# Patient Record
Sex: Male | Born: 1945
Health system: Southern US, Community
[De-identification: ages and names within clinical notes are randomized; demographics above are authoritative.]

## PROBLEM LIST (undated history)

## (undated) DIAGNOSIS — R519 Headache, unspecified: Secondary | ICD-10-CM

## (undated) DIAGNOSIS — I10 Essential (primary) hypertension: Secondary | ICD-10-CM

## (undated) DIAGNOSIS — I499 Cardiac arrhythmia, unspecified: Secondary | ICD-10-CM

## (undated) DIAGNOSIS — H269 Unspecified cataract: Secondary | ICD-10-CM

## (undated) DIAGNOSIS — K219 Gastro-esophageal reflux disease without esophagitis: Secondary | ICD-10-CM

## (undated) DIAGNOSIS — N400 Enlarged prostate without lower urinary tract symptoms: Secondary | ICD-10-CM

## (undated) DIAGNOSIS — R001 Bradycardia, unspecified: Secondary | ICD-10-CM

## (undated) DIAGNOSIS — G4730 Sleep apnea, unspecified: Secondary | ICD-10-CM

## (undated) DIAGNOSIS — Z8489 Family history of other specified conditions: Secondary | ICD-10-CM

## (undated) DIAGNOSIS — I77810 Thoracic aortic ectasia: Secondary | ICD-10-CM

## (undated) DIAGNOSIS — T7840XA Allergy, unspecified, initial encounter: Secondary | ICD-10-CM

## (undated) DIAGNOSIS — I340 Nonrheumatic mitral (valve) insufficiency: Secondary | ICD-10-CM

## (undated) DIAGNOSIS — R0600 Dyspnea, unspecified: Secondary | ICD-10-CM

## (undated) DIAGNOSIS — M1990 Unspecified osteoarthritis, unspecified site: Secondary | ICD-10-CM

## (undated) HISTORY — PX: OTHER SURGICAL HISTORY: SHX169

## (undated) HISTORY — PX: SHOULDER ARTHROSCOPY: SHX128

## (undated) HISTORY — PX: FRACTURE SURGERY: SHX138

## (undated) HISTORY — PX: JOINT REPLACEMENT: SHX530

## (undated) HISTORY — PX: TONSILLECTOMY: SUR1361

## (undated) HISTORY — DX: Allergy, unspecified, initial encounter: T78.40XA

## (undated) HISTORY — DX: Gastro-esophageal reflux disease without esophagitis: K21.9

## (undated) HISTORY — PX: CATARACT EXTRACTION, BILATERAL: SHX1313

## (undated) HISTORY — PX: TOTAL KNEE ARTHROPLASTY: SHX125

## (undated) HISTORY — PX: EYE SURGERY: SHX253

## (undated) HISTORY — PX: SKIN CANCER EXCISION: SHX779

## (undated) HISTORY — DX: Unspecified cataract: H26.9

## (undated) HISTORY — DX: Benign prostatic hyperplasia without lower urinary tract symptoms: N40.0

## (undated) HISTORY — PX: COSMETIC SURGERY: SHX468

---

## 1986-07-23 DIAGNOSIS — C439 Malignant melanoma of skin, unspecified: Secondary | ICD-10-CM

## 1986-07-23 HISTORY — DX: Malignant melanoma of skin, unspecified: C43.9

## 2014-01-26 DIAGNOSIS — D492 Neoplasm of unspecified behavior of bone, soft tissue, and skin: Secondary | ICD-10-CM | POA: Diagnosis not present

## 2014-02-04 DIAGNOSIS — D492 Neoplasm of unspecified behavior of bone, soft tissue, and skin: Secondary | ICD-10-CM | POA: Diagnosis not present

## 2014-02-04 DIAGNOSIS — L570 Actinic keratosis: Secondary | ICD-10-CM | POA: Diagnosis not present

## 2014-04-08 DIAGNOSIS — Z23 Encounter for immunization: Secondary | ICD-10-CM | POA: Diagnosis not present

## 2014-05-06 DIAGNOSIS — E669 Obesity, unspecified: Secondary | ICD-10-CM | POA: Diagnosis not present

## 2014-05-06 DIAGNOSIS — E785 Hyperlipidemia, unspecified: Secondary | ICD-10-CM | POA: Diagnosis not present

## 2014-05-06 DIAGNOSIS — I10 Essential (primary) hypertension: Secondary | ICD-10-CM | POA: Diagnosis not present

## 2014-05-06 DIAGNOSIS — Z0000 Encounter for general adult medical examination without abnormal findings: Secondary | ICD-10-CM | POA: Diagnosis not present

## 2014-05-06 DIAGNOSIS — K219 Gastro-esophageal reflux disease without esophagitis: Secondary | ICD-10-CM | POA: Diagnosis not present

## 2014-05-06 DIAGNOSIS — Z1159 Encounter for screening for other viral diseases: Secondary | ICD-10-CM | POA: Diagnosis not present

## 2014-05-06 DIAGNOSIS — Z13220 Encounter for screening for lipoid disorders: Secondary | ICD-10-CM | POA: Diagnosis not present

## 2014-05-06 DIAGNOSIS — Z131 Encounter for screening for diabetes mellitus: Secondary | ICD-10-CM | POA: Diagnosis not present

## 2014-05-06 DIAGNOSIS — N400 Enlarged prostate without lower urinary tract symptoms: Secondary | ICD-10-CM | POA: Diagnosis not present

## 2014-05-10 DIAGNOSIS — N400 Enlarged prostate without lower urinary tract symptoms: Secondary | ICD-10-CM | POA: Diagnosis not present

## 2014-05-10 DIAGNOSIS — Z0000 Encounter for general adult medical examination without abnormal findings: Secondary | ICD-10-CM | POA: Diagnosis not present

## 2014-05-10 DIAGNOSIS — K219 Gastro-esophageal reflux disease without esophagitis: Secondary | ICD-10-CM | POA: Diagnosis not present

## 2014-05-10 DIAGNOSIS — E669 Obesity, unspecified: Secondary | ICD-10-CM | POA: Diagnosis not present

## 2014-05-10 DIAGNOSIS — E785 Hyperlipidemia, unspecified: Secondary | ICD-10-CM | POA: Diagnosis not present

## 2014-05-10 DIAGNOSIS — I10 Essential (primary) hypertension: Secondary | ICD-10-CM | POA: Diagnosis not present

## 2014-05-10 DIAGNOSIS — Z6832 Body mass index (BMI) 32.0-32.9, adult: Secondary | ICD-10-CM | POA: Diagnosis not present

## 2014-05-12 DIAGNOSIS — Z0000 Encounter for general adult medical examination without abnormal findings: Secondary | ICD-10-CM | POA: Diagnosis not present

## 2014-05-12 DIAGNOSIS — E785 Hyperlipidemia, unspecified: Secondary | ICD-10-CM | POA: Diagnosis not present

## 2014-06-09 DIAGNOSIS — I10 Essential (primary) hypertension: Secondary | ICD-10-CM | POA: Diagnosis not present

## 2014-06-09 DIAGNOSIS — E785 Hyperlipidemia, unspecified: Secondary | ICD-10-CM | POA: Diagnosis not present

## 2014-06-09 DIAGNOSIS — I340 Nonrheumatic mitral (valve) insufficiency: Secondary | ICD-10-CM | POA: Diagnosis not present

## 2014-08-03 DIAGNOSIS — L853 Xerosis cutis: Secondary | ICD-10-CM | POA: Diagnosis not present

## 2014-08-03 DIAGNOSIS — D1801 Hemangioma of skin and subcutaneous tissue: Secondary | ICD-10-CM | POA: Diagnosis not present

## 2014-08-03 DIAGNOSIS — L570 Actinic keratosis: Secondary | ICD-10-CM | POA: Diagnosis not present

## 2014-08-03 DIAGNOSIS — D492 Neoplasm of unspecified behavior of bone, soft tissue, and skin: Secondary | ICD-10-CM | POA: Diagnosis not present

## 2014-08-03 DIAGNOSIS — D239 Other benign neoplasm of skin, unspecified: Secondary | ICD-10-CM | POA: Diagnosis not present

## 2014-08-03 DIAGNOSIS — D225 Melanocytic nevi of trunk: Secondary | ICD-10-CM | POA: Diagnosis not present

## 2014-08-03 DIAGNOSIS — I340 Nonrheumatic mitral (valve) insufficiency: Secondary | ICD-10-CM | POA: Diagnosis not present

## 2014-08-03 DIAGNOSIS — L919 Hypertrophic disorder of the skin, unspecified: Secondary | ICD-10-CM | POA: Diagnosis not present

## 2014-12-01 DIAGNOSIS — I2510 Atherosclerotic heart disease of native coronary artery without angina pectoris: Secondary | ICD-10-CM | POA: Diagnosis not present

## 2015-04-07 DIAGNOSIS — M19011 Primary osteoarthritis, right shoulder: Secondary | ICD-10-CM | POA: Diagnosis not present

## 2015-04-20 DIAGNOSIS — K219 Gastro-esophageal reflux disease without esophagitis: Secondary | ICD-10-CM | POA: Diagnosis not present

## 2015-04-20 DIAGNOSIS — Z6833 Body mass index (BMI) 33.0-33.9, adult: Secondary | ICD-10-CM | POA: Diagnosis not present

## 2015-04-20 DIAGNOSIS — Z23 Encounter for immunization: Secondary | ICD-10-CM | POA: Diagnosis not present

## 2015-04-20 DIAGNOSIS — M19019 Primary osteoarthritis, unspecified shoulder: Secondary | ICD-10-CM | POA: Diagnosis not present

## 2015-04-20 DIAGNOSIS — E785 Hyperlipidemia, unspecified: Secondary | ICD-10-CM | POA: Diagnosis not present

## 2015-04-20 DIAGNOSIS — N400 Enlarged prostate without lower urinary tract symptoms: Secondary | ICD-10-CM | POA: Diagnosis not present

## 2015-04-20 DIAGNOSIS — I10 Essential (primary) hypertension: Secondary | ICD-10-CM | POA: Diagnosis not present

## 2015-04-25 DIAGNOSIS — Z125 Encounter for screening for malignant neoplasm of prostate: Secondary | ICD-10-CM | POA: Diagnosis not present

## 2015-04-25 DIAGNOSIS — I10 Essential (primary) hypertension: Secondary | ICD-10-CM | POA: Diagnosis not present

## 2015-04-26 DIAGNOSIS — M19011 Primary osteoarthritis, right shoulder: Secondary | ICD-10-CM | POA: Diagnosis not present

## 2015-05-03 DIAGNOSIS — N400 Enlarged prostate without lower urinary tract symptoms: Secondary | ICD-10-CM | POA: Diagnosis not present

## 2015-05-03 DIAGNOSIS — Z0000 Encounter for general adult medical examination without abnormal findings: Secondary | ICD-10-CM | POA: Diagnosis not present

## 2015-05-03 DIAGNOSIS — Z1389 Encounter for screening for other disorder: Secondary | ICD-10-CM | POA: Diagnosis not present

## 2015-05-03 DIAGNOSIS — I10 Essential (primary) hypertension: Secondary | ICD-10-CM | POA: Diagnosis not present

## 2015-05-03 DIAGNOSIS — E785 Hyperlipidemia, unspecified: Secondary | ICD-10-CM | POA: Diagnosis not present

## 2015-05-03 DIAGNOSIS — Z23 Encounter for immunization: Secondary | ICD-10-CM | POA: Diagnosis not present

## 2015-05-03 DIAGNOSIS — Z6833 Body mass index (BMI) 33.0-33.9, adult: Secondary | ICD-10-CM | POA: Diagnosis not present

## 2015-05-03 DIAGNOSIS — J302 Other seasonal allergic rhinitis: Secondary | ICD-10-CM | POA: Diagnosis not present

## 2015-05-03 DIAGNOSIS — K219 Gastro-esophageal reflux disease without esophagitis: Secondary | ICD-10-CM | POA: Diagnosis not present

## 2015-05-03 DIAGNOSIS — M19019 Primary osteoarthritis, unspecified shoulder: Secondary | ICD-10-CM | POA: Diagnosis not present

## 2015-05-05 DIAGNOSIS — Z1212 Encounter for screening for malignant neoplasm of rectum: Secondary | ICD-10-CM | POA: Diagnosis not present

## 2015-05-25 DIAGNOSIS — M7552 Bursitis of left shoulder: Secondary | ICD-10-CM | POA: Diagnosis not present

## 2015-05-25 DIAGNOSIS — M19012 Primary osteoarthritis, left shoulder: Secondary | ICD-10-CM | POA: Diagnosis not present

## 2015-05-25 DIAGNOSIS — M19011 Primary osteoarthritis, right shoulder: Secondary | ICD-10-CM | POA: Diagnosis not present

## 2015-05-25 DIAGNOSIS — M7551 Bursitis of right shoulder: Secondary | ICD-10-CM | POA: Diagnosis not present

## 2015-05-30 ENCOUNTER — Other Ambulatory Visit: Payer: Self-pay | Admitting: Allergy

## 2015-05-30 ENCOUNTER — Ambulatory Visit
Admission: RE | Admit: 2015-05-30 | Discharge: 2015-05-30 | Disposition: A | Discharge status: Home / Self Care | Payer: Medicare Other | Source: Ambulatory Visit | Attending: Allergy | Admitting: Allergy

## 2015-05-30 DIAGNOSIS — J3081 Allergic rhinitis due to animal (cat) (dog) hair and dander: Secondary | ICD-10-CM | POA: Diagnosis not present

## 2015-05-30 DIAGNOSIS — R05 Cough: Secondary | ICD-10-CM

## 2015-05-30 DIAGNOSIS — J3089 Other allergic rhinitis: Secondary | ICD-10-CM | POA: Diagnosis not present

## 2015-05-30 DIAGNOSIS — R059 Cough, unspecified: Secondary | ICD-10-CM

## 2015-05-30 DIAGNOSIS — H1045 Other chronic allergic conjunctivitis: Secondary | ICD-10-CM | POA: Diagnosis not present

## 2015-05-30 DIAGNOSIS — J309 Allergic rhinitis, unspecified: Secondary | ICD-10-CM | POA: Diagnosis not present

## 2015-06-22 DIAGNOSIS — M25562 Pain in left knee: Secondary | ICD-10-CM | POA: Diagnosis not present

## 2015-06-22 DIAGNOSIS — M25561 Pain in right knee: Secondary | ICD-10-CM | POA: Diagnosis not present

## 2015-06-22 DIAGNOSIS — M7552 Bursitis of left shoulder: Secondary | ICD-10-CM | POA: Diagnosis not present

## 2015-06-22 DIAGNOSIS — M7551 Bursitis of right shoulder: Secondary | ICD-10-CM | POA: Diagnosis not present

## 2015-06-28 DIAGNOSIS — M6281 Muscle weakness (generalized): Secondary | ICD-10-CM | POA: Diagnosis not present

## 2015-06-30 DIAGNOSIS — M6281 Muscle weakness (generalized): Secondary | ICD-10-CM | POA: Diagnosis not present

## 2015-07-04 DIAGNOSIS — M6281 Muscle weakness (generalized): Secondary | ICD-10-CM | POA: Diagnosis not present

## 2015-07-06 DIAGNOSIS — M6281 Muscle weakness (generalized): Secondary | ICD-10-CM | POA: Diagnosis not present

## 2015-07-08 DIAGNOSIS — M6281 Muscle weakness (generalized): Secondary | ICD-10-CM | POA: Diagnosis not present

## 2015-07-11 DIAGNOSIS — M6281 Muscle weakness (generalized): Secondary | ICD-10-CM | POA: Diagnosis not present

## 2015-07-13 DIAGNOSIS — M6281 Muscle weakness (generalized): Secondary | ICD-10-CM | POA: Diagnosis not present

## 2015-07-20 DIAGNOSIS — M6281 Muscle weakness (generalized): Secondary | ICD-10-CM | POA: Diagnosis not present

## 2015-07-22 DIAGNOSIS — M6281 Muscle weakness (generalized): Secondary | ICD-10-CM | POA: Diagnosis not present

## 2015-08-22 DIAGNOSIS — J343 Hypertrophy of nasal turbinates: Secondary | ICD-10-CM | POA: Diagnosis not present

## 2015-08-22 DIAGNOSIS — H903 Sensorineural hearing loss, bilateral: Secondary | ICD-10-CM | POA: Diagnosis not present

## 2015-08-22 DIAGNOSIS — R42 Dizziness and giddiness: Secondary | ICD-10-CM | POA: Diagnosis not present

## 2015-08-22 DIAGNOSIS — J310 Chronic rhinitis: Secondary | ICD-10-CM | POA: Diagnosis not present

## 2015-08-22 DIAGNOSIS — H9313 Tinnitus, bilateral: Secondary | ICD-10-CM | POA: Diagnosis not present

## 2015-08-23 ENCOUNTER — Other Ambulatory Visit (INDEPENDENT_AMBULATORY_CARE_PROVIDER_SITE_OTHER): Payer: Self-pay | Admitting: Otolaryngology

## 2015-08-23 DIAGNOSIS — J329 Chronic sinusitis, unspecified: Secondary | ICD-10-CM

## 2015-08-26 ENCOUNTER — Ambulatory Visit
Admission: RE | Admit: 2015-08-26 | Discharge: 2015-08-26 | Disposition: A | Discharge status: Home / Self Care | Payer: Medicare Other | Source: Ambulatory Visit | Attending: Otolaryngology | Admitting: Otolaryngology

## 2015-08-26 DIAGNOSIS — J329 Chronic sinusitis, unspecified: Secondary | ICD-10-CM | POA: Diagnosis not present

## 2015-08-26 DIAGNOSIS — M76899 Other specified enthesopathies of unspecified lower limb, excluding foot: Secondary | ICD-10-CM | POA: Diagnosis not present

## 2015-08-26 DIAGNOSIS — M25562 Pain in left knee: Secondary | ICD-10-CM | POA: Diagnosis not present

## 2015-08-26 DIAGNOSIS — M25561 Pain in right knee: Secondary | ICD-10-CM | POA: Diagnosis not present

## 2015-08-26 DIAGNOSIS — R51 Headache: Secondary | ICD-10-CM | POA: Diagnosis not present

## 2016-03-29 DIAGNOSIS — L430 Hypertrophic lichen planus: Secondary | ICD-10-CM | POA: Diagnosis not present

## 2016-03-29 DIAGNOSIS — D485 Neoplasm of uncertain behavior of skin: Secondary | ICD-10-CM | POA: Diagnosis not present

## 2016-03-29 DIAGNOSIS — C4492 Squamous cell carcinoma of skin, unspecified: Secondary | ICD-10-CM

## 2016-03-29 DIAGNOSIS — D239 Other benign neoplasm of skin, unspecified: Secondary | ICD-10-CM | POA: Diagnosis not present

## 2016-03-29 DIAGNOSIS — C44329 Squamous cell carcinoma of skin of other parts of face: Secondary | ICD-10-CM | POA: Diagnosis not present

## 2016-03-29 DIAGNOSIS — L570 Actinic keratosis: Secondary | ICD-10-CM | POA: Diagnosis not present

## 2016-03-29 DIAGNOSIS — D225 Melanocytic nevi of trunk: Secondary | ICD-10-CM | POA: Diagnosis not present

## 2016-03-29 DIAGNOSIS — Z85820 Personal history of malignant melanoma of skin: Secondary | ICD-10-CM | POA: Diagnosis not present

## 2016-03-29 HISTORY — DX: Squamous cell carcinoma of skin, unspecified: C44.92

## 2016-04-26 DIAGNOSIS — C44329 Squamous cell carcinoma of skin of other parts of face: Secondary | ICD-10-CM | POA: Diagnosis not present

## 2016-04-30 DIAGNOSIS — E784 Other hyperlipidemia: Secondary | ICD-10-CM | POA: Diagnosis not present

## 2016-04-30 DIAGNOSIS — I10 Essential (primary) hypertension: Secondary | ICD-10-CM | POA: Diagnosis not present

## 2016-04-30 DIAGNOSIS — Z125 Encounter for screening for malignant neoplasm of prostate: Secondary | ICD-10-CM | POA: Diagnosis not present

## 2016-05-04 DIAGNOSIS — Z1212 Encounter for screening for malignant neoplasm of rectum: Secondary | ICD-10-CM | POA: Diagnosis not present

## 2016-05-07 DIAGNOSIS — Z1389 Encounter for screening for other disorder: Secondary | ICD-10-CM | POA: Diagnosis not present

## 2016-05-07 DIAGNOSIS — K219 Gastro-esophageal reflux disease without esophagitis: Secondary | ICD-10-CM | POA: Diagnosis not present

## 2016-05-07 DIAGNOSIS — I10 Essential (primary) hypertension: Secondary | ICD-10-CM | POA: Diagnosis not present

## 2016-05-07 DIAGNOSIS — Z23 Encounter for immunization: Secondary | ICD-10-CM | POA: Diagnosis not present

## 2016-05-07 DIAGNOSIS — N400 Enlarged prostate without lower urinary tract symptoms: Secondary | ICD-10-CM | POA: Diagnosis not present

## 2016-05-07 DIAGNOSIS — Z0000 Encounter for general adult medical examination without abnormal findings: Secondary | ICD-10-CM | POA: Diagnosis not present

## 2016-05-07 DIAGNOSIS — J302 Other seasonal allergic rhinitis: Secondary | ICD-10-CM | POA: Diagnosis not present

## 2016-05-07 DIAGNOSIS — R413 Other amnesia: Secondary | ICD-10-CM | POA: Diagnosis not present

## 2016-05-07 DIAGNOSIS — Z6835 Body mass index (BMI) 35.0-35.9, adult: Secondary | ICD-10-CM | POA: Diagnosis not present

## 2016-05-07 DIAGNOSIS — R51 Headache: Secondary | ICD-10-CM | POA: Diagnosis not present

## 2016-05-07 DIAGNOSIS — E784 Other hyperlipidemia: Secondary | ICD-10-CM | POA: Diagnosis not present

## 2016-07-24 DIAGNOSIS — D229 Melanocytic nevi, unspecified: Secondary | ICD-10-CM | POA: Diagnosis not present

## 2016-07-24 DIAGNOSIS — L570 Actinic keratosis: Secondary | ICD-10-CM | POA: Diagnosis not present

## 2016-07-24 DIAGNOSIS — Z85820 Personal history of malignant melanoma of skin: Secondary | ICD-10-CM | POA: Diagnosis not present

## 2016-08-09 DIAGNOSIS — H5213 Myopia, bilateral: Secondary | ICD-10-CM | POA: Diagnosis not present

## 2016-08-09 DIAGNOSIS — H2513 Age-related nuclear cataract, bilateral: Secondary | ICD-10-CM | POA: Diagnosis not present

## 2016-08-09 DIAGNOSIS — H524 Presbyopia: Secondary | ICD-10-CM | POA: Diagnosis not present

## 2016-10-16 DIAGNOSIS — H2511 Age-related nuclear cataract, right eye: Secondary | ICD-10-CM | POA: Diagnosis not present

## 2016-10-16 DIAGNOSIS — H25811 Combined forms of age-related cataract, right eye: Secondary | ICD-10-CM | POA: Diagnosis not present

## 2016-10-30 DIAGNOSIS — H2512 Age-related nuclear cataract, left eye: Secondary | ICD-10-CM | POA: Diagnosis not present

## 2016-10-30 DIAGNOSIS — H25812 Combined forms of age-related cataract, left eye: Secondary | ICD-10-CM | POA: Diagnosis not present

## 2016-10-30 DIAGNOSIS — H52202 Unspecified astigmatism, left eye: Secondary | ICD-10-CM | POA: Diagnosis not present

## 2016-11-06 DIAGNOSIS — T8522XA Displacement of intraocular lens, initial encounter: Secondary | ICD-10-CM | POA: Diagnosis not present

## 2017-05-07 DIAGNOSIS — I10 Essential (primary) hypertension: Secondary | ICD-10-CM | POA: Diagnosis not present

## 2017-05-07 DIAGNOSIS — E7849 Other hyperlipidemia: Secondary | ICD-10-CM | POA: Diagnosis not present

## 2017-05-07 DIAGNOSIS — Z125 Encounter for screening for malignant neoplasm of prostate: Secondary | ICD-10-CM | POA: Diagnosis not present

## 2017-05-14 DIAGNOSIS — I10 Essential (primary) hypertension: Secondary | ICD-10-CM | POA: Diagnosis not present

## 2017-05-14 DIAGNOSIS — K219 Gastro-esophageal reflux disease without esophagitis: Secondary | ICD-10-CM | POA: Diagnosis not present

## 2017-05-14 DIAGNOSIS — Z0000 Encounter for general adult medical examination without abnormal findings: Secondary | ICD-10-CM | POA: Diagnosis not present

## 2017-05-14 DIAGNOSIS — E668 Other obesity: Secondary | ICD-10-CM | POA: Diagnosis not present

## 2017-05-14 DIAGNOSIS — Z1389 Encounter for screening for other disorder: Secondary | ICD-10-CM | POA: Diagnosis not present

## 2017-05-14 DIAGNOSIS — Z23 Encounter for immunization: Secondary | ICD-10-CM | POA: Diagnosis not present

## 2017-05-14 DIAGNOSIS — R413 Other amnesia: Secondary | ICD-10-CM | POA: Diagnosis not present

## 2017-05-14 DIAGNOSIS — N400 Enlarged prostate without lower urinary tract symptoms: Secondary | ICD-10-CM | POA: Diagnosis not present

## 2017-05-14 DIAGNOSIS — E7849 Other hyperlipidemia: Secondary | ICD-10-CM | POA: Diagnosis not present

## 2017-05-14 DIAGNOSIS — Z6835 Body mass index (BMI) 35.0-35.9, adult: Secondary | ICD-10-CM | POA: Diagnosis not present

## 2017-05-20 DIAGNOSIS — Z1212 Encounter for screening for malignant neoplasm of rectum: Secondary | ICD-10-CM | POA: Diagnosis not present

## 2017-11-28 DIAGNOSIS — H524 Presbyopia: Secondary | ICD-10-CM | POA: Diagnosis not present

## 2017-11-28 DIAGNOSIS — Z961 Presence of intraocular lens: Secondary | ICD-10-CM | POA: Diagnosis not present

## 2017-12-03 DIAGNOSIS — J342 Deviated nasal septum: Secondary | ICD-10-CM | POA: Diagnosis not present

## 2017-12-03 DIAGNOSIS — H9313 Tinnitus, bilateral: Secondary | ICD-10-CM | POA: Diagnosis not present

## 2017-12-03 DIAGNOSIS — H90A21 Sensorineural hearing loss, unilateral, right ear, with restricted hearing on the contralateral side: Secondary | ICD-10-CM | POA: Diagnosis not present

## 2017-12-03 DIAGNOSIS — H90A32 Mixed conductive and sensorineural hearing loss, unilateral, left ear with restricted hearing on the contralateral side: Secondary | ICD-10-CM | POA: Diagnosis not present

## 2017-12-03 DIAGNOSIS — H903 Sensorineural hearing loss, bilateral: Secondary | ICD-10-CM | POA: Diagnosis not present

## 2018-03-18 DIAGNOSIS — D692 Other nonthrombocytopenic purpura: Secondary | ICD-10-CM | POA: Diagnosis not present

## 2018-03-18 DIAGNOSIS — L570 Actinic keratosis: Secondary | ICD-10-CM | POA: Diagnosis not present

## 2018-03-18 DIAGNOSIS — D229 Melanocytic nevi, unspecified: Secondary | ICD-10-CM | POA: Diagnosis not present

## 2018-05-13 DIAGNOSIS — E7849 Other hyperlipidemia: Secondary | ICD-10-CM | POA: Diagnosis not present

## 2018-05-13 DIAGNOSIS — Z125 Encounter for screening for malignant neoplasm of prostate: Secondary | ICD-10-CM | POA: Diagnosis not present

## 2018-05-13 DIAGNOSIS — I10 Essential (primary) hypertension: Secondary | ICD-10-CM | POA: Diagnosis not present

## 2018-05-30 DIAGNOSIS — Z0000 Encounter for general adult medical examination without abnormal findings: Secondary | ICD-10-CM | POA: Diagnosis not present

## 2018-05-30 DIAGNOSIS — Z1389 Encounter for screening for other disorder: Secondary | ICD-10-CM | POA: Diagnosis not present

## 2018-05-30 DIAGNOSIS — R82998 Other abnormal findings in urine: Secondary | ICD-10-CM | POA: Diagnosis not present

## 2018-05-30 DIAGNOSIS — K219 Gastro-esophageal reflux disease without esophagitis: Secondary | ICD-10-CM | POA: Diagnosis not present

## 2018-05-30 DIAGNOSIS — M79673 Pain in unspecified foot: Secondary | ICD-10-CM | POA: Diagnosis not present

## 2018-05-30 DIAGNOSIS — I10 Essential (primary) hypertension: Secondary | ICD-10-CM | POA: Diagnosis not present

## 2018-05-30 DIAGNOSIS — Z23 Encounter for immunization: Secondary | ICD-10-CM | POA: Diagnosis not present

## 2018-05-30 DIAGNOSIS — E7849 Other hyperlipidemia: Secondary | ICD-10-CM | POA: Diagnosis not present

## 2018-05-30 DIAGNOSIS — R42 Dizziness and giddiness: Secondary | ICD-10-CM | POA: Diagnosis not present

## 2018-05-30 DIAGNOSIS — Z6835 Body mass index (BMI) 35.0-35.9, adult: Secondary | ICD-10-CM | POA: Diagnosis not present

## 2018-05-30 DIAGNOSIS — E668 Other obesity: Secondary | ICD-10-CM | POA: Diagnosis not present

## 2018-05-30 DIAGNOSIS — M5489 Other dorsalgia: Secondary | ICD-10-CM | POA: Diagnosis not present

## 2018-06-04 DIAGNOSIS — Z1212 Encounter for screening for malignant neoplasm of rectum: Secondary | ICD-10-CM | POA: Diagnosis not present

## 2018-07-02 DIAGNOSIS — M2021 Hallux rigidus, right foot: Secondary | ICD-10-CM | POA: Diagnosis not present

## 2018-07-02 DIAGNOSIS — M7742 Metatarsalgia, left foot: Secondary | ICD-10-CM | POA: Diagnosis not present

## 2018-07-02 DIAGNOSIS — M2022 Hallux rigidus, left foot: Secondary | ICD-10-CM | POA: Diagnosis not present

## 2018-07-02 DIAGNOSIS — M7741 Metatarsalgia, right foot: Secondary | ICD-10-CM | POA: Diagnosis not present

## 2018-07-02 DIAGNOSIS — M2042 Other hammer toe(s) (acquired), left foot: Secondary | ICD-10-CM | POA: Diagnosis not present

## 2018-07-02 DIAGNOSIS — M79672 Pain in left foot: Secondary | ICD-10-CM | POA: Diagnosis not present

## 2018-07-02 DIAGNOSIS — M79671 Pain in right foot: Secondary | ICD-10-CM | POA: Diagnosis not present

## 2018-10-06 DIAGNOSIS — R197 Diarrhea, unspecified: Secondary | ICD-10-CM | POA: Diagnosis not present

## 2018-10-06 DIAGNOSIS — R109 Unspecified abdominal pain: Secondary | ICD-10-CM | POA: Diagnosis not present

## 2018-11-10 DIAGNOSIS — R197 Diarrhea, unspecified: Secondary | ICD-10-CM | POA: Diagnosis not present

## 2018-11-10 DIAGNOSIS — R109 Unspecified abdominal pain: Secondary | ICD-10-CM | POA: Diagnosis not present

## 2018-11-11 DIAGNOSIS — R7301 Impaired fasting glucose: Secondary | ICD-10-CM | POA: Diagnosis not present

## 2018-11-11 DIAGNOSIS — R197 Diarrhea, unspecified: Secondary | ICD-10-CM | POA: Diagnosis not present

## 2018-12-04 DIAGNOSIS — H5310 Unspecified subjective visual disturbances: Secondary | ICD-10-CM | POA: Diagnosis not present

## 2018-12-04 DIAGNOSIS — Z961 Presence of intraocular lens: Secondary | ICD-10-CM | POA: Diagnosis not present

## 2018-12-04 DIAGNOSIS — H52203 Unspecified astigmatism, bilateral: Secondary | ICD-10-CM | POA: Diagnosis not present

## 2018-12-04 DIAGNOSIS — H524 Presbyopia: Secondary | ICD-10-CM | POA: Diagnosis not present

## 2019-04-17 DIAGNOSIS — Z23 Encounter for immunization: Secondary | ICD-10-CM | POA: Diagnosis not present

## 2019-05-25 DIAGNOSIS — H26492 Other secondary cataract, left eye: Secondary | ICD-10-CM | POA: Diagnosis not present

## 2019-06-09 DIAGNOSIS — H26492 Other secondary cataract, left eye: Secondary | ICD-10-CM | POA: Diagnosis not present

## 2019-07-14 DIAGNOSIS — Z125 Encounter for screening for malignant neoplasm of prostate: Secondary | ICD-10-CM | POA: Diagnosis not present

## 2019-07-15 DIAGNOSIS — Z1212 Encounter for screening for malignant neoplasm of rectum: Secondary | ICD-10-CM | POA: Diagnosis not present

## 2019-08-20 ENCOUNTER — Ambulatory Visit: Payer: Medicare Other

## 2019-08-25 ENCOUNTER — Ambulatory Visit: Payer: Medicare Other

## 2019-08-27 ENCOUNTER — Ambulatory Visit: Payer: Medicare Other | Attending: Internal Medicine

## 2019-08-27 DIAGNOSIS — Z23 Encounter for immunization: Secondary | ICD-10-CM | POA: Insufficient documentation

## 2019-08-27 NOTE — Progress Notes (Signed)
   Covid-19 Vaccination Clinic  Name:  Michael Pace    MRN: [id number] DOB: August 13, 1945  08/27/2019  [name] was observed post Covid-19 immunization for 30 minutes based on pre-vaccination screening without incidence. He was provided with Vaccine Information Sheet and instruction to access the V-Safe system.   [name] was instructed to call 911 with any severe reactions post vaccine: Marland Kitchen Difficulty breathing  . Swelling of your face and throat  . A fast heartbeat  . A bad rash all over your body  . Dizziness and weakness    Immunizations Administered    Name Date Dose VIS Date Route   Pfizer COVID-19 Vaccine 08/27/2019  9:09 AM 0.3 mL 07/03/2019 Intramuscular   Manufacturer: [place]   Lot: [id number]   [place]: [id number]

## 2019-09-22 ENCOUNTER — Ambulatory Visit: Payer: Medicare Other | Attending: Internal Medicine

## 2019-09-22 DIAGNOSIS — Z23 Encounter for immunization: Secondary | ICD-10-CM

## 2019-09-22 NOTE — Progress Notes (Signed)
   Covid-19 Vaccination Clinic  Name:  Michael Pace    MRN: [id number] DOB: 12-18-1945  09/22/2019  Michael Pace was observed post Covid-19 immunization for 30 minutes based on pre-vaccination screening without incident. He was provided with Vaccine Information Sheet and instruction to access the V-Safe system.   Michael Pace was instructed to call 911 with any severe reactions post vaccine: Marland Kitchen Difficulty breathing  . Swelling of face and throat  . A fast heartbeat  . A bad rash all over body  . Dizziness and weakness   Immunizations Administered    Name Date Dose VIS Date Route   Pfizer COVID-19 Vaccine 09/22/2019  9:21 AM 0.3 mL 07/03/2019 Intramuscular   Manufacturer: [place]   Lot: [id number]   [place]: [id number]

## 2019-11-19 ENCOUNTER — Ambulatory Visit (INDEPENDENT_AMBULATORY_CARE_PROVIDER_SITE_OTHER): Payer: Medicare Other | Admitting: Podiatry

## 2019-11-19 ENCOUNTER — Other Ambulatory Visit: Payer: Self-pay

## 2019-11-19 ENCOUNTER — Ambulatory Visit (INDEPENDENT_AMBULATORY_CARE_PROVIDER_SITE_OTHER): Payer: Medicare Other

## 2019-11-19 ENCOUNTER — Encounter: Payer: Self-pay | Admitting: Podiatry

## 2019-11-19 DIAGNOSIS — M778 Other enthesopathies, not elsewhere classified: Secondary | ICD-10-CM | POA: Diagnosis not present

## 2019-11-19 DIAGNOSIS — M19072 Primary osteoarthritis, left ankle and foot: Secondary | ICD-10-CM | POA: Diagnosis not present

## 2019-11-19 MED ORDER — MELOXICAM 15 MG PO TABS
15.0000 mg | ORAL_TABLET | Freq: Every day | ORAL | 3 refills | Status: DC
Start: 2019-11-19 — End: 2022-09-13

## 2019-11-19 NOTE — Progress Notes (Signed)
  Subjective:  Patient ID: Michael Pace, male    DOB: 20-Sep-1945,  MRN: [id number] HPI Chief Complaint  Patient presents with  . Foot Pain    Dorsal midfoot left - aching x 1 year, worsened recently, walking a lot and certain shoes aggravate it, tried stretching, some numbness plantar forefoot   . New Patient (Initial Visit)    74 y.o. male presents with the above complaint.   ROS: Denies fever chills nausea vomiting muscle aches pains calf pain back pain chest pain shortness of breath.  No past medical history on file.   Current Outpatient Medications:  .  amLODipine (NORVASC) 5 MG tablet, Take 5 mg by mouth daily., Disp: , Rfl:  .  benazepril (LOTENSIN) 20 MG tablet, Take 20 mg by mouth daily., Disp: , Rfl:  .  carvedilol (COREG) 12.5 MG tablet, Take 12.5 mg by mouth 2 (two) times daily., Disp: , Rfl:  .  meloxicam (MOBIC) 15 MG tablet, Take 1 tablet (15 mg total) by mouth daily., Disp: 30 tablet, Rfl: 3 .  simvastatin (ZOCOR) 40 MG tablet, Take 40 mg by mouth at bedtime., Disp: , Rfl:   Allergies  Allergen Reactions  . Codeine   . Penicillins    Review of Systems Objective:  There were no vitals filed for this visit.  General: Well developed, nourished, in no acute distress, alert and oriented x3   Dermatological: Skin is warm, dry and supple bilateral. Nails x 10 are well maintained; remaining integument appears unremarkable at this time. There are no open sores, no preulcerative lesions, no rash or signs of infection present.  Vascular: Dorsalis Pedis artery and Posterior Tibial artery pedal pulses are 2/4 bilateral with immedate capillary fill time. Pedal hair growth present. No varicosities and no lower extremity edema present bilateral.   Neruologic: Grossly intact via light touch bilateral. Vibratory intact via tuning fork bilateral. Protective threshold with Semmes Wienstein monofilament intact to all pedal sites bilateral. Patellar and Achilles deep tendon  reflexes 2+ bilateral. No Babinski or clonus noted bilateral.   Musculoskeletal: No gross boney pedal deformities bilateral. No pain, crepitus, or limitation noted with foot and ankle range of motion bilateral. Muscular strength 5/5 in all groups tested bilateral.  Pain on palpation on attempted motion of the metatarsal cuneiform joints.  He also has pain on palpation to this area.  Gait: Unassisted, Nonantalgic.    Radiographs:  Radiographs taken today demonstrate an osseously mature individual with mild osteopenia.  Significant osteoarthritis with dorsal spurring at the level of the tarsometatarsal joints bilaterally.  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  Plan: Discussed etiology pathology and surgical therapies I injected the left dorsal aspect of the foot today with Kenalog and local anesthetic sent mobic to his pharmacy and I will follow-up with him on an as-needed basis.     Danika Kluender T. [place], [place]

## 2019-11-26 DIAGNOSIS — R5383 Other fatigue: Secondary | ICD-10-CM | POA: Diagnosis not present

## 2019-11-26 DIAGNOSIS — I10 Essential (primary) hypertension: Secondary | ICD-10-CM | POA: Diagnosis not present

## 2019-11-26 DIAGNOSIS — R001 Bradycardia, unspecified: Secondary | ICD-10-CM | POA: Diagnosis not present

## 2019-11-26 DIAGNOSIS — E785 Hyperlipidemia, unspecified: Secondary | ICD-10-CM | POA: Diagnosis not present

## 2019-11-26 DIAGNOSIS — R0600 Dyspnea, unspecified: Secondary | ICD-10-CM | POA: Diagnosis not present

## 2019-11-30 NOTE — Progress Notes (Signed)
Cardiology Office Note   Date:  12/01/2019   ID:  Michael Pace, DOB 1946/03/23, MRN [id number]  PCP:  Geoffery Lyons, NP  Cardiologist:   No primary care provider on file.   Chief Complaint  Patient presents with  . Fatigue      History of Present Illness: Michael Pace is a 74 y.o. male who is referred by Geoffery Lyons, NP for evaluation of bradycardia and SOB.    The patient does have some past cardiac history but I do not have any records.  He moved from [facility] about 7 or 8 years ago which was the last time he saw cardiologist.  Sounds like he has a history of tachycardia.  He describes what sounds like a cardioversion.  The [facility] catheterization or an EP procedure.  He still occasionally gets palpitations but they are not nearly as severe as they used to be.  They might last 10 to 20 minutes.  I do see some mention of mitral regurgitation but I have no details.  The patient denies any syncope.  He does have chronic shortness of breath.  However, he has not had new PND or orthopnea.  He does describe decreased exercise tolerance.  He said 6 weeks ago he was walking 45 minutes but now only 25 minutes will make him fatigued.  He has not had any chest pressure, neck or arm discomfort.  He does have fatigue.  He has had bradycardia and because of this at a recent office visit with his primary provider his carvedilol was reduced in half.  .  Past Medical History:  Diagnosis Date  . BPH (benign prostatic hyperplasia)   . Cataracts, bilateral   . GERD (gastroesophageal reflux disease)     Past Surgical History:  Procedure Laterality Date  . SKIN CANCER EXCISION     Melanoma  . TOTAL KNEE ARTHROPLASTY     bilateral     Current Outpatient Medications  Medication Sig Dispense Refill  . amLODipine (NORVASC) 5 MG tablet Take 5 mg by mouth daily.    . benazepril (LOTENSIN) 20 MG tablet Take 20 mg by mouth daily.    . carvedilol (COREG) 12.5 MG tablet Take 6.25  mg by mouth 2 (two) times daily.     . meloxicam (MOBIC) 15 MG tablet Take 1 tablet (15 mg total) by mouth daily. 30 tablet 3  . simvastatin (ZOCOR) 40 MG tablet Take 40 mg by mouth at bedtime.     No current facility-administered medications for this visit.    Allergies:   Codeine and Penicillins    Social History:  The patient  reports that he has never smoked. He has never used smokeless tobacco. He reports current alcohol use.   Family History:  The patient's family history includes Cancer in his mother.    ROS:  Please see the history of present illness.   Otherwise, review of systems are positive for none.   All other systems are reviewed and negative.   His father is my patient   PHYSICAL EXAM: VS:  BP 115/78   Pulse (!) 55   Temp (!) 97.3 F (36.3 C)   Ht 5\' 10"  (1.778 m)   Wt 242 lb 9.6 oz (110 kg)   SpO2 96%   BMI 34.81 kg/m  , BMI Body mass index is 34.81 kg/m. GENERAL:  Well appearing HEENT:  Pupils equal round and reactive, fundi not visualized, oral mucosa unremarkable NECK:  No jugular  venous distention, waveform within normal limits, carotid upstroke brisk and symmetric, no bruits, no thyromegaly LYMPHATICS:  No cervical, inguinal adenopathy LUNGS:  Clear to auscultation bilaterally BACK:  No CVA tenderness CHEST:  Unremarkable HEART:  PMI not displaced or sustained,S1 and S2 within normal limits, no S3, no S4, no clicks, no rubs, 2 out of 6 apical systolic murmur heard best slightly in the axilla, no diastolic murmurs ABD:  Flat, positive bowel sounds normal in frequency in pitch, no bruits, no rebound, no guarding, no midline pulsatile mass, no hepatomegaly, no splenomegaly EXT:  2 plus pulses throughout, no edema, no cyanosis no clubbing SKIN:  No rashes no nodules NEURO:  Cranial nerves II through XII grossly intact, motor grossly intact throughout PSYCH:  Cognitively intact, oriented to person place and time    EKG:  EKG is ordered today. The ekg  ordered today demonstrates sinus bradycardia, rate 55, questionable old inferior infarct, poor anterior R wave progression, no acute ST-T wave changes.   Recent Labs: No results found for requested labs within last 8760 hours.    Lipid Panel No results found for: CHOL, TRIG, HDL, CHOLHDL, VLDL, LDLCALC, LDLDIRECT    Wt Readings from Last 3 Encounters:  12/01/19 242 lb 9.6 oz (110 kg)      Other studies Reviewed: Additional studies/ records that were reviewed today include: Primary care records. Review of the above records demonstrates:  Please see elsewhere in the note.     ASSESSMENT AND PLAN:  BRADYCARDIA:   I agree with reducing the carvedilol as was done.  If however he still fatigued we might eventually need to get rid of this altogether although there is a history of tachycardia to contend with.  He and I had this discussion.  FATIGUE: He does not have any history of snoring or sleep disturbance.  I do not see a recent TSH I will order 1 of these.  Again this might need to be addressed with discontinuation of his carvedilol.  DYSPNEA: I am going to start with a BNP level and an echocardiogram.  ABNORMAL EKG: He has a questionable old inferior infarct.  I will assess wall motion with the echo.  I will also try to get the records from his previous cardiologist.  I will have a low threshold for ischemia screening.  COVID EDUCATION: He has had his vaccines.  Current medicines are reviewed at length with the patient today.  The patient does not have concerns regarding medicines.  The following changes have been made:  no change  Labs/ tests ordered today include:   Orders Placed This Encounter  Procedures  . Brain natriuretic peptide  . TSH  . EKG 12-Lead  . ECHOCARDIOGRAM COMPLETE     Disposition:   FU with me in 1 month.     Signed, Minus Breeding, MD  12/01/2019 3:27 PM    [facility]

## 2019-12-01 ENCOUNTER — Encounter: Payer: Self-pay | Admitting: Cardiology

## 2019-12-01 ENCOUNTER — Ambulatory Visit (INDEPENDENT_AMBULATORY_CARE_PROVIDER_SITE_OTHER): Payer: Medicare Other | Admitting: Cardiology

## 2019-12-01 ENCOUNTER — Other Ambulatory Visit: Payer: Self-pay

## 2019-12-01 VITALS — BP 115/78 | HR 55 | Temp 97.3°F | Ht 70.0 in | Wt 242.6 lb

## 2019-12-01 DIAGNOSIS — R001 Bradycardia, unspecified: Secondary | ICD-10-CM

## 2019-12-01 DIAGNOSIS — R0602 Shortness of breath: Secondary | ICD-10-CM | POA: Diagnosis not present

## 2019-12-01 DIAGNOSIS — Z7189 Other specified counseling: Secondary | ICD-10-CM | POA: Diagnosis not present

## 2019-12-01 DIAGNOSIS — R002 Palpitations: Secondary | ICD-10-CM

## 2019-12-01 NOTE — Patient Instructions (Signed)
Medication Instructions:  NO CHANGES *If you need a refill on your cardiac medications before your next appointment, please call your pharmacy*  Lab Work: Your physician recommends that you return for lab work TODAY (BNP, TSH) If you have labs (blood work) drawn today and your tests are completely normal, you will receive your results only by: Marland Kitchen MyChart Message (if you have MyChart) OR . A paper copy in the mail If you have any lab test that is abnormal or we need to change your treatment, we will call you to review the results.   Testing/Procedures: Your physician has requested that you have an echocardiogram. Echocardiography is a painless test that uses sound waves to create images of your heart. It provides your doctor with information about the size and shape of your heart and how well your heart's chambers and valves are working. This procedure takes approximately one hour. There are no restrictions for this procedure. [place]  Follow-Up: At [facility], you and your health needs are our priority.  As part of our continuing mission to provide you with exceptional heart care, we have created designated Provider Care Teams.  These Care Teams include your primary Cardiologist (physician) and Advanced Practice Providers (APPs -  Physician Assistants and Nurse Practitioners) who all work together to provide you with the care you need, when you need it.  Your next appointment:   1 month(s)  The format for your next appointment:   In Person  Provider:   Minus Breeding, MD  Other Instructions: [place]

## 2019-12-02 LAB — BRAIN NATRIURETIC PEPTIDE: BNP: 32.8 pg/mL (ref 0.0–100.0)

## 2019-12-02 LAB — TSH: TSH: 2.32 u[IU]/mL (ref 0.450–4.500)

## 2019-12-24 ENCOUNTER — Other Ambulatory Visit: Payer: Self-pay

## 2019-12-24 ENCOUNTER — Ambulatory Visit (HOSPITAL_COMMUNITY): Payer: Medicare Other | Attending: Cardiovascular Disease

## 2019-12-24 DIAGNOSIS — R0602 Shortness of breath: Secondary | ICD-10-CM | POA: Diagnosis not present

## 2019-12-29 ENCOUNTER — Other Ambulatory Visit: Payer: Self-pay

## 2019-12-29 ENCOUNTER — Ambulatory Visit (INDEPENDENT_AMBULATORY_CARE_PROVIDER_SITE_OTHER): Payer: Medicare Other | Admitting: Podiatry

## 2019-12-29 ENCOUNTER — Encounter: Payer: Self-pay | Admitting: Podiatry

## 2019-12-29 DIAGNOSIS — M19072 Primary osteoarthritis, left ankle and foot: Secondary | ICD-10-CM

## 2019-12-29 DIAGNOSIS — M778 Other enthesopathies, not elsewhere classified: Secondary | ICD-10-CM

## 2019-12-29 NOTE — Progress Notes (Signed)
He presents today for follow-up of his capsulitis dorsal aspect of the bilateral foot he states that he is about 75 to 80% better he states that it really does not bother me that much now thanks to those injections.  He states my big toe of the right foot is starting to become a little more sore at the joint particularly when up and active at the gym.  Objective: Vital signs are stable alert oriented x3.  There is no erythema edema cellulitis drainage or odor he has no crepitation on range of motion of the first metatarsophalangeal joint of the right foot.  He does have limited range of motion and tenderness on sharp dorsiflexion.  Spurs are noted dorsally on palpation.  No pain on palpation of the dorsal aspect of the right foot though the left foot does still demonstrate some tenderness associated with the dorsal spurring at the TMT joints.  Assessment: Well-healing capsulitis osteoarthritis left foot capsulitis first metatarsophalangeal joint right foot with hallux limitus.  Plan: Discussed etiology pathology conservative versus surgical therapies.  We did discuss using topical anti-inflammatory such as Voltaren gel.  We also discussed the possible need for surgical intervention at some point.  We did discuss injection therapy to the toes he declined any treatment today.

## 2020-01-06 DIAGNOSIS — R9431 Abnormal electrocardiogram [ECG] [EKG]: Secondary | ICD-10-CM | POA: Insufficient documentation

## 2020-01-06 DIAGNOSIS — I340 Nonrheumatic mitral (valve) insufficiency: Secondary | ICD-10-CM | POA: Insufficient documentation

## 2020-01-06 DIAGNOSIS — R5383 Other fatigue: Secondary | ICD-10-CM | POA: Insufficient documentation

## 2020-01-06 DIAGNOSIS — R0602 Shortness of breath: Secondary | ICD-10-CM | POA: Insufficient documentation

## 2020-01-06 DIAGNOSIS — R001 Bradycardia, unspecified: Secondary | ICD-10-CM | POA: Insufficient documentation

## 2020-01-06 NOTE — Progress Notes (Signed)
Cardiology Office Note   Date:  01/07/2020   ID:  Michael Pace, DOB 12-Mar-1946, MRN [id number]  PCP:  Michael Lyons, NP  Cardiologist:   No primary care provider on file.   Chief Complaint  Patient presents with  . Fatigue      History of Present Illness: Michael Pace is a 74 y.o. male who is referred by Michael Lyons, NP for evaluation of bradycardia and SOB.    He moved from [facility] about 7 or 8 years ago which was the last time he saw cardiologist.  He has a history of tachycardia.   He saw me because of palpitations.  He also had dyspnea.  He had his beta blocker decreased.    Echo demonstrated mild/mod MR and mild AI.  His EF was normal.  BNP and TSH were normal.    He returns for follow-up.  Today I do have some records from [place].  He had a perfusion study in 2007 with no ischemia.  Echocardiogram in 2006 and again in 2016.  He had a normal ejection fraction.  There were no other significant abnormalities.  There is a report of mild coronary disease in 2002.  He had an SVT and an EP study in 2009.  No pathway was identified with no inducible arrhythmias.  I reviewed these records specifically for this appointment.  Since I last saw him he has tiredness probably is about the same.  He is not having any more shortness of breath and has had for years.  He is not describing any chest pressure, neck or arm discomfort.  He is not having any palpitations, presyncope or syncope.  He has had no PND or orthopnea.  He is walking routinely.  Past Medical History:  Diagnosis Date  . BPH (benign prostatic hyperplasia)   . Cataracts, bilateral   . GERD (gastroesophageal reflux disease)     Past Surgical History:  Procedure Laterality Date  . SKIN CANCER EXCISION     Melanoma  . TOTAL KNEE ARTHROPLASTY     bilateral     Current Outpatient Medications  Medication Sig Dispense Refill  . amLODipine (NORVASC) 5 MG tablet Take 5 mg by mouth daily.    . benazepril  (LOTENSIN) 20 MG tablet Take 20 mg by mouth daily.    . carvedilol (COREG) 12.5 MG tablet Take 6.25 mg by mouth 2 (two) times daily.     . meloxicam (MOBIC) 15 MG tablet Take 1 tablet (15 mg total) by mouth daily. 30 tablet 3  . simvastatin (ZOCOR) 40 MG tablet Take 40 mg by mouth at bedtime.     No current facility-administered medications for this visit.    Allergies:   Codeine and Penicillins    ROS:  Please see the history of present illness.   Otherwise, review of systems are positive for none.   All other systems are reviewed and negative.   His father is my patient   PHYSICAL EXAM: VS:  BP 114/78   Pulse 66   Ht 5' 10.5" (1.791 m)   Wt 243 lb 12.8 oz (110.6 kg)   BMI 34.49 kg/m  , BMI Body mass index is 34.49 kg/m. GENERAL:  Well appearing NECK:  No jugular venous distention, waveform within normal limits, carotid upstroke brisk and symmetric, no bruits, no thyromegaly LUNGS:  Clear to auscultation bilaterally CHEST:  Unremarkable HEART:  PMI not displaced or sustained,S1 and S2 within normal limits, no S3, no S4, no  clicks, no rubs, 2 out of 6 apical systolic murmur heard slightly to the axilla, no diastolic murmurs ABD:  Flat, positive bowel sounds normal in frequency in pitch, no bruits, no rebound, no guarding, no midline pulsatile mass, no hepatomegaly, no splenomegaly EXT:  2 plus pulses throughout, mild leg edema, no cyanosis no clubbing   EKG:  EKG is not ordered today.    Recent Labs: 12/01/2019: BNP 32.8; TSH 2.320    Lipid Panel No results found for: CHOL, TRIG, HDL, CHOLHDL, VLDL, LDLCALC, LDLDIRECT    Wt Readings from Last 3 Encounters:  01/07/20 243 lb 12.8 oz (110.6 kg)  12/01/19 242 lb 9.6 oz (110 kg)      Other studies Reviewed: Additional studies/ records that were reviewed today include: [place] records Review of the above records demonstrates:  Please see elsewhere in the note.     ASSESSMENT AND PLAN:  BRADYCARDIA: Of note there is  documented SVT that he apparently required adenosine 6 times.  However, he had a negative EP study.    I have reduced his beta-blocker but I am balancing this against the risk of increased tachypalpitations.  He will let me know and if he still fatigued I might still need to get rid of the beta-blocker.  For now we will leave it at the current dose.   MR: This was mild in 2016.  This seems to be mild to moderate now and I will follow up with an echo in 1 year  AORTIC ROOT : He will have a CT of his aortic root in 6 months.  FATIGUE:   This will be assessed as above.  He is going to pursue exercise.  We will consider going down the beta-blocker.  Of note TSH was normal.  He is not anemic.  DYSPNEA:   This seems to be baseline.  BNP was normal.  Echo demonstrated no significant abnormalities.  If this persists with some weight loss and exercise I will pursue an ischemia study though I have a low pretest probability that this is the issue.    ABNORMAL EKG:   There are some abnormal Q waves but no evidence of wall motion.  This will be assessed as above.  He had no obstructive coronary disease below.\  COVID EDUCATION: He has had his vaccines.  Current medicines are reviewed at length with the patient today.  The patient does not have concerns regarding medicines.  The following changes have been made:  no change  Labs/ tests ordered today include:  Orders Placed This Encounter  Procedures  . CT ANGIO CHEST AORTA W/CM & OR WO/CM  . Basic metabolic panel  . ECHOCARDIOGRAM COMPLETE     Disposition:   FU with me in 3 months.     Signed, Minus Breeding, MD  01/07/2020 1:50 PM    [facility]

## 2020-01-07 ENCOUNTER — Other Ambulatory Visit: Payer: Self-pay

## 2020-01-07 ENCOUNTER — Encounter: Payer: Self-pay | Admitting: Cardiology

## 2020-01-07 ENCOUNTER — Ambulatory Visit (INDEPENDENT_AMBULATORY_CARE_PROVIDER_SITE_OTHER): Payer: Medicare Other | Admitting: Cardiology

## 2020-01-07 VITALS — BP 114/78 | HR 66 | Ht 70.5 in | Wt 243.8 lb

## 2020-01-07 DIAGNOSIS — R5383 Other fatigue: Secondary | ICD-10-CM | POA: Diagnosis not present

## 2020-01-07 DIAGNOSIS — R9431 Abnormal electrocardiogram [ECG] [EKG]: Secondary | ICD-10-CM

## 2020-01-07 DIAGNOSIS — I7789 Other specified disorders of arteries and arterioles: Secondary | ICD-10-CM | POA: Diagnosis not present

## 2020-01-07 DIAGNOSIS — R0602 Shortness of breath: Secondary | ICD-10-CM

## 2020-01-07 DIAGNOSIS — I340 Nonrheumatic mitral (valve) insufficiency: Secondary | ICD-10-CM | POA: Diagnosis not present

## 2020-01-07 DIAGNOSIS — Z01812 Encounter for preprocedural laboratory examination: Secondary | ICD-10-CM

## 2020-01-07 DIAGNOSIS — R001 Bradycardia, unspecified: Secondary | ICD-10-CM | POA: Diagnosis not present

## 2020-01-07 NOTE — Patient Instructions (Signed)
Medication Instructions:  NO CHANGES *If you need a refill on your cardiac medications before your next appointment, please call your pharmacy*  Lab Work: Your physician recommends that you return for lab work BEFORE YOUR CT AORTA (BMP)  Testing/Procedures: Your physician has requested that you have an echocardiogram IN ONE YEAR. Echocardiography is a painless test that uses sound waves to create images of your heart. It provides your doctor with information about the size and shape of your heart and how well your heart's chambers and valves are working. This procedure takes approximately one hour. There are no restrictions for this procedure. [place] [address]   IN 6 MONTHS - CT AORTA / LABS BEFORE - Non-Cardiac CT scanning, (CAT scanning), is a noninvasive, special x-ray that produces cross-sectional images of the body using x-rays and a computer. CT scans help physicians diagnose and treat medical conditions. For some CT exams, a contrast material is used to enhance visibility in the area of the body being studied. CT scans provide greater clarity and reveal more details than regular x-ray exams.   Follow-Up: At [facility], you and your health needs are our priority.  As part of our continuing mission to provide you with exceptional heart care, we have created designated Provider Care Teams.  These Care Teams include your primary Cardiologist (physician) and Advanced Practice Providers (APPs -  Physician Assistants and Nurse Practitioners) who all work together to provide you with the care you need, when you need it.  Your next appointment:   3 month(s)  The format for your next appointment:   In Person  Provider:   Minus Breeding, MD

## 2020-03-16 DIAGNOSIS — Z23 Encounter for immunization: Secondary | ICD-10-CM | POA: Diagnosis not present

## 2020-04-06 DIAGNOSIS — I77810 Thoracic aortic ectasia: Secondary | ICD-10-CM | POA: Insufficient documentation

## 2020-04-06 DIAGNOSIS — Z7189 Other specified counseling: Secondary | ICD-10-CM | POA: Insufficient documentation

## 2020-04-06 NOTE — Progress Notes (Signed)
Cardiology Office Note   Date:  04/07/2020   ID:  Devrin Monforte, DOB [birth year], MRN [id number]  PCP:  Geoffery Lyons, NP  Cardiologist:   No primary care provider on file.   Chief Complaint  Patient presents with  . Dizziness      History of Present Illness: Michael Pace is a 74 y.o. male who is referred by Geoffery Lyons, NP for evaluation of bradycardia and SOB.    He moved from [place] about  8 years ago which was the last time he saw cardiologist.  He has a history of tachycardia.   He saw me because of palpitations.  He also had dyspnea.  He had his beta blocker decreased.    Echo demonstrated mild/mod MR and mild AI.  His EF was normal.  BNP and TSH were normal.  Records from [facility]demonstrated a perfusion study in 2007 with no ischemia.  Echocardiogram in 2006 and again in 2016 demonstrated a normal ejection fraction.  There is a report of mild coronary disease in 2002.  He had an SVT and an EP study in 2009.  No pathway was identified with no inducible arrhythmias.    Since I last saw him he has done okay.  He gets about 4-6 episodes of tachypalpitations every year.  He denies any chest pressure, neck or arm discomfort.  He says he goes somewhere quiet and his palpitations go away.  Is not had any presyncope or syncope associated with him.  He does get episodes of dizziness.  He is concerned because he has been getting some of these and also has some memory disorder.  He was hoping to see a neurologist.  Past Medical History:  Diagnosis Date  . BPH (benign prostatic hyperplasia)   . Cataracts, bilateral   . GERD (gastroesophageal reflux disease)     Past Surgical History:  Procedure Laterality Date  . SKIN CANCER EXCISION     Melanoma  . TOTAL KNEE ARTHROPLASTY     bilateral     Current Outpatient Medications  Medication Sig Dispense Refill  . amLODipine (NORVASC) 5 MG tablet Take 5 mg by mouth daily.    . benazepril (LOTENSIN) 20 MG tablet Take 20  mg by mouth daily.    . carvedilol (COREG) 12.5 MG tablet Take 6.25 mg by mouth 2 (two) times daily.     . meloxicam (MOBIC) 15 MG tablet Take 1 tablet (15 mg total) by mouth daily. 30 tablet 3  . simvastatin (ZOCOR) 40 MG tablet Take 40 mg by mouth at bedtime.     No current facility-administered medications for this visit.    Allergies:   Codeine and Penicillins    ROS:  Please see the history of present illness.   Otherwise, review of systems are positive for none.   All other systems are reviewed and negative.   His father is my patient   PHYSICAL EXAM: VS:  BP 115/71   Pulse 70   Temp (!) 97 F (36.1 C)   Ht 5\' 10"  (1.778 m)   Wt 247 lb (112 kg)   SpO2 96%   BMI 35.44 kg/m  , BMI Body mass index is 35.44 kg/m. GENERAL:  Well appearing NECK:  No jugular venous distention, waveform within normal limits, carotid upstroke brisk and symmetric, no bruits, no thyromegaly LUNGS:  Clear to auscultation bilaterally CHEST:  Unremarkable HEART:  PMI not displaced or sustained,S1 and S2 within normal limits, no S3, no S4, no  clicks, no rubs, 2 out of 6 brief apical systolic murmur nonradiating, no diastolic murmurs ABD:  Flat, positive bowel sounds normal in frequency in pitch, no bruits, no rebound, no guarding, no midline pulsatile mass, no hepatomegaly, no splenomegaly EXT:  2 plus pulses throughout, no edema, no cyanosis no clubbing     EKG:  EKG is not ordered today.    Recent Labs: 12/01/2019: BNP 32.8; TSH 2.320    Lipid Panel No results found for: CHOL, TRIG, HDL, CHOLHDL, VLDL, LDLCALC, LDLDIRECT    Wt Readings from Last 3 Encounters:  04/07/20 247 lb (112 kg)  01/07/20 243 lb 12.8 oz (110.6 kg)  12/01/19 242 lb 9.6 oz (110 kg)      Other studies Reviewed: Additional studies/ records that were reviewed today include: Echo Review of the above records demonstrates:  Please see elsewhere in the note.     ASSESSMENT AND PLAN:  BRADYCARDIA:   The patient's not  having bradycardia currently and says his heart rate is actually gone up.  He has rare episodes of SVT that are self-limited.  I do not think that any of his dizziness is related to a rhythm issue but he will try to track this.  No further testing or change in therapy at this point.  MR: This was mild on his echo earlier this year.  I will follow this clinically.   AORTIC ROOT :   He was to have a follow up CT. I will schedule this.   DYSPNEA:   BNP was normal.  Echo demonstrated no significant abnormalities.  This is probably weight and deconditioning.  No change in therapy.  MEMORY DISORDER: He is concerned about this.  He requests a neurology consult and I will arrange this.  COVID EDUCATION:   He has had his vaccines.  Current medicines are reviewed at length with the patient today.  The patient does not have concerns regarding medicines.  The following changes have been made:    Labs/ tests ordered today include:  None Orders Placed This Encounter  Procedures  . Ambulatory referral to Neurology     Disposition:   FU with me in 12 months.     Signed, Minus Breeding, MD  04/07/2020 3:34 PM    [facility]

## 2020-04-07 ENCOUNTER — Encounter: Payer: Self-pay | Admitting: Cardiology

## 2020-04-07 ENCOUNTER — Other Ambulatory Visit: Payer: Self-pay

## 2020-04-07 ENCOUNTER — Ambulatory Visit (INDEPENDENT_AMBULATORY_CARE_PROVIDER_SITE_OTHER): Payer: Medicare Other | Admitting: Cardiology

## 2020-04-07 VITALS — BP 115/71 | HR 70 | Temp 97.0°F | Ht 70.0 in | Wt 247.0 lb

## 2020-04-07 DIAGNOSIS — R413 Other amnesia: Secondary | ICD-10-CM | POA: Diagnosis not present

## 2020-04-07 DIAGNOSIS — R5383 Other fatigue: Secondary | ICD-10-CM | POA: Diagnosis not present

## 2020-04-07 DIAGNOSIS — I77810 Thoracic aortic ectasia: Secondary | ICD-10-CM

## 2020-04-07 DIAGNOSIS — R0602 Shortness of breath: Secondary | ICD-10-CM | POA: Diagnosis not present

## 2020-04-07 DIAGNOSIS — R001 Bradycardia, unspecified: Secondary | ICD-10-CM

## 2020-04-07 DIAGNOSIS — Z7189 Other specified counseling: Secondary | ICD-10-CM

## 2020-04-07 DIAGNOSIS — I340 Nonrheumatic mitral (valve) insufficiency: Secondary | ICD-10-CM | POA: Diagnosis not present

## 2020-04-07 DIAGNOSIS — R519 Headache, unspecified: Secondary | ICD-10-CM | POA: Diagnosis not present

## 2020-04-07 NOTE — Patient Instructions (Signed)
Medication Instructions:  Your physician recommends that you continue on your current medications as directed. Please refer to the Current Medication list given to you today.  *If you need a refill on your cardiac medications before your next appointment, please call your pharmacy*  Testing/Procedures: CTA Aorta in December 2021  Follow-Up: At [facility], you and your health needs are our priority.  As part of our continuing mission to provide you with exceptional heart care, we have created designated Provider Care Teams.  These Care Teams include your primary Cardiologist (physician) and Advanced Practice Providers (APPs -  Physician Assistants and Nurse Practitioners) who all work together to provide you with the care you need, when you need it.  We recommend signing up for the patient portal called "MyChart".  Sign up information is provided on this After Visit Summary.  MyChart is used to connect with patients for Virtual Visits (Telemedicine).  Patients are able to view lab/test results, encounter notes, upcoming appointments, etc.  Non-urgent messages can be sent to your provider as well.   To learn more about what you can do with MyChart, go to [email or web address].    Your next appointment:   12 month(s)  The format for your next appointment:   In Person  Provider:   Minus Breeding, MD   Other Instructions You have been referred to Dr. Dohmeier-Neurologist

## 2020-04-08 ENCOUNTER — Ambulatory Visit: Payer: Medicare Other | Admitting: Cardiology

## 2020-05-06 DIAGNOSIS — Z23 Encounter for immunization: Secondary | ICD-10-CM | POA: Diagnosis not present

## 2020-05-23 ENCOUNTER — Telehealth: Payer: Self-pay | Admitting: Cardiology

## 2020-05-23 NOTE — Telephone Encounter (Signed)
Left message for patient to call and discuss scheduling CTA chest aorta ordered by Dr. Eula Fried

## 2020-05-24 ENCOUNTER — Ambulatory Visit
Admission: RE | Admit: 2020-05-24 | Discharge: 2020-05-24 | Disposition: A | Discharge status: Home / Self Care | Payer: Medicare Other | Source: Ambulatory Visit | Attending: Cardiology | Admitting: Cardiology

## 2020-05-24 DIAGNOSIS — I700 Atherosclerosis of aorta: Secondary | ICD-10-CM | POA: Diagnosis not present

## 2020-05-24 DIAGNOSIS — J984 Other disorders of lung: Secondary | ICD-10-CM | POA: Diagnosis not present

## 2020-05-24 DIAGNOSIS — I712 Thoracic aortic aneurysm, without rupture: Secondary | ICD-10-CM | POA: Diagnosis not present

## 2020-05-24 DIAGNOSIS — I7789 Other specified disorders of arteries and arterioles: Secondary | ICD-10-CM

## 2020-05-24 DIAGNOSIS — K8080 Other cholelithiasis without obstruction: Secondary | ICD-10-CM | POA: Diagnosis not present

## 2020-05-24 MED ORDER — IOPAMIDOL (ISOVUE-370) INJECTION 76%
75.0000 mL | Freq: Once | INTRAVENOUS | Status: AC | PRN
  Administered 2020-05-24: 75 mL via INTRAVENOUS

## 2020-05-30 ENCOUNTER — Other Ambulatory Visit: Payer: Self-pay

## 2020-05-30 DIAGNOSIS — I700 Atherosclerosis of aorta: Secondary | ICD-10-CM

## 2020-06-07 ENCOUNTER — Other Ambulatory Visit: Payer: Self-pay

## 2020-06-07 ENCOUNTER — Ambulatory Visit (INDEPENDENT_AMBULATORY_CARE_PROVIDER_SITE_OTHER): Payer: Medicare Other

## 2020-06-07 ENCOUNTER — Encounter: Payer: Self-pay | Admitting: Orthopaedic Surgery

## 2020-06-07 ENCOUNTER — Ambulatory Visit: Payer: Self-pay

## 2020-06-07 ENCOUNTER — Ambulatory Visit (INDEPENDENT_AMBULATORY_CARE_PROVIDER_SITE_OTHER): Payer: Medicare Other | Admitting: Orthopaedic Surgery

## 2020-06-07 DIAGNOSIS — Z96652 Presence of left artificial knee joint: Secondary | ICD-10-CM | POA: Diagnosis not present

## 2020-06-07 DIAGNOSIS — M25561 Pain in right knee: Secondary | ICD-10-CM

## 2020-06-07 DIAGNOSIS — G8929 Other chronic pain: Secondary | ICD-10-CM | POA: Diagnosis not present

## 2020-06-07 DIAGNOSIS — Z96651 Presence of right artificial knee joint: Secondary | ICD-10-CM | POA: Diagnosis not present

## 2020-06-07 DIAGNOSIS — M25562 Pain in left knee: Secondary | ICD-10-CM | POA: Diagnosis not present

## 2020-06-07 NOTE — Progress Notes (Signed)
Office Visit Note   Patient: Michael Pace           Date of Birth: October 22, 1945           MRN: [id number] Visit Date: 06/07/2020              Requested by: Geoffery Lyons, NP [place],  [place] [id number] PCP: Geoffery Lyons, NP   Assessment & Plan: Visit Diagnoses:  1. Chronic pain of left knee   2. Chronic pain of right knee   3. History of total right knee replacement   4. History of total left knee replacement     Plan: Certainly after 20 years of having bilateral knees there is some polyethylene liner wear and he may need revising of the polyliner's.  I would like to obtain a three-phase bone scan to rule out prosthetic loosening of both knees.  We also may end up recommending just physical therapy for his knees but I would like to see him back after the three-phase bone scan.  We will also see if we can determine what the actual manufacturer of his knee replacements are because this will help with the revision aspect of things if we need to recommend polyliner exchanges.  Follow-Up Instructions: Return in about 3 weeks (around 06/28/2020).   Orders:  Orders Placed This Encounter  Procedures  . XR Knee 1-2 Views Left  . XR Knee 1-2 Views Right   No orders of the defined types were placed in this encounter.     Procedures: No procedures performed   Clinical Data: No additional findings.   Subjective: Chief Complaint  Patient presents with  . Right Knee - Pain  . Left Knee - Pain  Patient is a very pleasant 74 year old active gentleman who has a history of bilateral knee replacements done in [place] 20 years ago.  He had both of these done at once.  For last 4 years has been having pain with both his knees with walking and sometimes straighten his knees.  Sometimes sitting for a while does cause bilateral knee pain.  He denies any significant swelling.  He does take meloxicam for pain as needed.  He comes in for further ration treatment of his knee  pain and to see how these replacements are doing overall.  He does not need an assistive device to walk any try to stay active.  He denies any other significant acute changes in his medical status.  He is not a diabetic.  HPI  Review of Systems He currently denies any headache, chest pain, shortness of breath, fever, chills, nausea, vomiting  Objective: Vital Signs: There were no vitals taken for this visit.  Physical Exam He is alert and orient x3 and in no acute distress Ortho Exam Examination of both knees shows no effusion.  Both knees feel ligamentously stable with good range of motion overall. Specialty Comments:  No specialty comments available.  Imaging: XR Knee 1-2 Views Left  Result Date: 06/07/2020 2 views of the left knee show total knee arthroplasty with no complicating features.  There is no gross evidence of osteolysis or loosening.  There is no acute findings otherwise.  There is no effusion.  XR Knee 1-2 Views Right  Result Date: 06/07/2020 2 views of the right knee show a total knee arthroplasty that appears well-seated with no evidence of osteolysis or loosening.  There is no acute findings or effusion.    PMFS History: Patient Active Problem List  Diagnosis Date Noted  . History of total right knee replacement 06/07/2020  . History of total left knee replacement 06/07/2020  . Educated about COVID-19 virus infection 04/06/2020  . Dilated aortic root ([place]) 04/06/2020  . Bradycardia 01/06/2020  . Fatigue 01/06/2020  . SOB (shortness of breath) 01/06/2020  . Nonspecific abnormal electrocardiogram (ECG) (EKG) 01/06/2020  . Nonrheumatic mitral valve regurgitation 01/06/2020   Past Medical History:  Diagnosis Date  . BPH (benign prostatic hyperplasia)   . Cataracts, bilateral   . GERD (gastroesophageal reflux disease)     Family History  Problem Relation Age of Onset  . Cancer Mother        No primary    Past Surgical History:  Procedure Laterality  Date  . SKIN CANCER EXCISION     Melanoma  . TOTAL KNEE ARTHROPLASTY     bilateral   Social History   Occupational History  . Not on file  Tobacco Use  . Smoking status: Never Smoker  . Smokeless tobacco: Never Used  Substance and Sexual Activity  . Alcohol use: Yes  . Drug use: Not on file  . Sexual activity: Not on file

## 2020-06-10 DIAGNOSIS — H43813 Vitreous degeneration, bilateral: Secondary | ICD-10-CM | POA: Diagnosis not present

## 2020-06-10 DIAGNOSIS — Z961 Presence of intraocular lens: Secondary | ICD-10-CM | POA: Diagnosis not present

## 2020-06-10 DIAGNOSIS — H52203 Unspecified astigmatism, bilateral: Secondary | ICD-10-CM | POA: Diagnosis not present

## 2020-06-10 DIAGNOSIS — H04123 Dry eye syndrome of bilateral lacrimal glands: Secondary | ICD-10-CM | POA: Diagnosis not present

## 2020-06-14 DIAGNOSIS — R7303 Prediabetes: Secondary | ICD-10-CM | POA: Diagnosis not present

## 2020-06-14 DIAGNOSIS — I77810 Thoracic aortic ectasia: Secondary | ICD-10-CM | POA: Diagnosis not present

## 2020-06-14 DIAGNOSIS — R519 Headache, unspecified: Secondary | ICD-10-CM | POA: Diagnosis not present

## 2020-06-14 DIAGNOSIS — M25561 Pain in right knee: Secondary | ICD-10-CM | POA: Diagnosis not present

## 2020-06-14 DIAGNOSIS — R42 Dizziness and giddiness: Secondary | ICD-10-CM | POA: Diagnosis not present

## 2020-06-14 DIAGNOSIS — I10 Essential (primary) hypertension: Secondary | ICD-10-CM | POA: Diagnosis not present

## 2020-06-14 DIAGNOSIS — I700 Atherosclerosis of aorta: Secondary | ICD-10-CM | POA: Diagnosis not present

## 2020-06-14 DIAGNOSIS — I471 Supraventricular tachycardia: Secondary | ICD-10-CM | POA: Diagnosis not present

## 2020-06-14 DIAGNOSIS — I340 Nonrheumatic mitral (valve) insufficiency: Secondary | ICD-10-CM | POA: Diagnosis not present

## 2020-06-14 DIAGNOSIS — Z0000 Encounter for general adult medical examination without abnormal findings: Secondary | ICD-10-CM | POA: Diagnosis not present

## 2020-06-14 DIAGNOSIS — R600 Localized edema: Secondary | ICD-10-CM | POA: Diagnosis not present

## 2020-06-23 ENCOUNTER — Encounter: Payer: Self-pay | Admitting: Neurology

## 2020-06-23 ENCOUNTER — Telehealth: Payer: Self-pay | Admitting: Neurology

## 2020-06-23 ENCOUNTER — Ambulatory Visit (INDEPENDENT_AMBULATORY_CARE_PROVIDER_SITE_OTHER): Payer: Medicare Other | Admitting: Neurology

## 2020-06-23 VITALS — BP 125/84 | HR 64 | Ht 70.5 in | Wt 251.0 lb

## 2020-06-23 DIAGNOSIS — R001 Bradycardia, unspecified: Secondary | ICD-10-CM | POA: Diagnosis not present

## 2020-06-23 DIAGNOSIS — R413 Other amnesia: Secondary | ICD-10-CM

## 2020-06-23 DIAGNOSIS — I340 Nonrheumatic mitral (valve) insufficiency: Secondary | ICD-10-CM | POA: Diagnosis not present

## 2020-06-23 DIAGNOSIS — R0602 Shortness of breath: Secondary | ICD-10-CM | POA: Diagnosis not present

## 2020-06-23 DIAGNOSIS — T733XXA Exhaustion due to excessive exertion, initial encounter: Secondary | ICD-10-CM

## 2020-06-23 NOTE — Patient Instructions (Addendum)
Screening for Sleep Apnea  Sleep apnea is a condition in which breathing pauses or becomes shallow during sleep. Sleep apnea screening is a test to determine if you are at risk for sleep apnea. The test is easy and only takes a few minutes. Your health care provider may ask you to have this test in preparation for surgery or as part of a physical exam. What are the symptoms of sleep apnea? Common symptoms of sleep apnea include:  Snoring.  Restless sleep.  Daytime sleepiness.  Pauses in breathing.  Choking during sleep.  Irritability.  Forgetfulness.  Trouble thinking clearly.  Depression.  Personality changes. Most people with sleep apnea are not aware that they have it. Why should I get screened? Getting screened for sleep apnea can help:  Ensure your safety. It is important for your health care providers to know whether or not you have sleep apnea, especially if you are having surgery or have other long-term (chronic) health conditions.  Improve your health and allow you to get a better night's rest. Restful sleep can help you: ? Have more energy. ? Lose weight. ? Improve high blood pressure. ? Improve diabetes management. ? Prevent stroke. ? Prevent car accidents. How is screening done? Screening usually includes being asked a list of questions about your sleep quality. Some questions you may be asked include:  Do you snore?  Is your sleep restless?  Do you have daytime sleepiness?  Has a partner or spouse told you that you stop breathing during sleep?  Have you had trouble concentrating or memory loss? If your screening test is positive, you are at risk for the condition. Further testing may be needed to confirm a diagnosis of sleep apnea. Where to find more information You can find screening tools online or at your health care clinic. For more information about sleep apnea screening and healthy sleep, visit these websites:  Centers for Disease Control and  Prevention: [employer]pl  American Sleep Apnea Association: www.sleepapnea.org Contact a health care provider if:  You think that you may have sleep apnea. Summary  Sleep apnea screening can help determine if you are at risk for sleep apnea.  It is important for your health care providers to know whether or not you have sleep apnea, especially if you are having surgery or have other chronic health conditions.  You may be asked to take a screening test for sleep apnea in preparation for surgery or as part of a physical exam. This information is not intended to replace advice given to you by your health care provider. Make sure you discuss any questions you have with your health care provider. Document Revised: 04/25/2018 Document Reviewed: 10/19/2016 Elsevier Patient Education  [place].   Management of Memory Problems  There are some general things you can do to help manage your memory problems.  Your memory may not in fact recover, but by using techniques and strategies you will be able to manage your memory difficulties better.  1)  Establish a routine.  Try to establish and then stick to a regular routine.  By doing this, you will get used to what to expect and you will reduce the need to rely on your memory.  Also, try to do things at the same time of day, such as taking your medication or checking your calendar first thing in the morning.  Think about think that you can do as a part of a regular routine and make a list.  Then enter  them into a daily planner to remind you.  This will help you establish a routine.  2)  Organize your environment.  Organize your environment so that it is uncluttered.  Decrease visual stimulation.  Place everyday items such as keys or cell phone in the same place every day (ie.  Basket next to front door)  Use post it notes with a brief message to yourself (ie. Turn off light, lock the door)  Use labels to indicate where  things go (ie. Which cupboards are for food, dishes, etc.)  Keep a notepad and pen by the telephone to take messages  3)  Memory Aids  A diary or journal/notebook/daily planner  Making a list (shopping list, chore list, to do list that needs to be done)  Using an alarm as a reminder (kitchen timer or cell phone alarm)  Using cell phone to store information (Notes, Calendar, Reminders)  Calendar/White board placed in a prominent position  Post-it notes  In order for memory aids to be useful, you need to have good habits.  It's no good remembering to make a note in your journal if you don't remember to look in it.  Try setting aside a certain time of day to look in journal.  4)  Improving mood and managing fatigue.  There may be other factors that contribute to memory difficulties.  Factors, such as anxiety, depression and tiredness can affect memory.  Regular gentle exercise can help improve your mood and give you more energy.  Simple relaxation techniques may help relieve symptoms of anxiety  Try to get back to completing activities or hobbies you enjoyed doing in the past.  Learn to pace yourself through activities to decrease fatigue.  Find out about some local support groups where you can share experiences with others.  Try and achieve 7-8 hours of sleep at nght.  There are well-accepted and sensible ways to reduce risk for Alzheimers disease and other degenerative brain disorders .  Exercise Daily Walk A daily 20 minute walk should be part of your routine. Disease related apathy can be a significant roadblock to exercise and the only way to overcome this is to make it a daily routine and perhaps have a reward at the end (something your loved one loves to eat or drink perhaps) or a personal trainer coming to the home can also be very useful. Most importantly, the patient is much more likely to exercise if the caregiver / spouse does it with him/her. In general a structured,  repetitive schedule is best.  General Health: Any diseases which effect your body will effect your brain such as a pneumonia, urinary infection, blood clot, heart attack or stroke. Keep contact with your primary care doctor for regular follow ups.  Sleep. A good nights sleep is healthy for the brain. Seven hours is recommended. If you have insomnia or poor sleep habits we can give you some instructions. If you have sleep apnea wear your mask.  Diet: Eating a heart healthy diet is also a good idea; fish and poultry instead of red meat, nuts (mostly non-peanuts), vegetables, fruits, olive oil or canola oil (instead of butter), minimal salt (use other spices to flavor foods), whole grain rice, bread, cereal and pasta and wine in moderation.Research is now showing that the MIND diet, which is a combination of The Mediterranean diet and the DASH diet, is beneficial for cognitive processing and longevity. Information about this diet can be found in The MIND Diet, a book by  Doyne Keel, MS, RDN, and online at [employer]book[employer].si  [employer], Power of Attorney and Advance Directives: You should consider putting legal safeguards in place with regard to financial and medical decision making. While the spouse always has power of attorney for medical and financial issues in the absence of any form, you should consider what you want in case the spouse / caregiver is no longer around or capable of making decisions.   The Alzheimers Association Position on Disease Prevention  Can Alzheimer's be prevented? It's a question that continues to intrigue researchers and fuel new investigations. There are no clear-cut answers yet -- partially due to the need for more large-scale studies in diverse populations -- but promising research is under way. The Alzheimer's Association is leading the worldwide effort to find a treatment for Alzheimer's, delay its onset and prevent it from developing.   What  causes Alzheimer's? Experts agree that in the vast majority of cases, Alzheimer's, like other common chronic conditions, probably develops as a result of complex interactions among multiple factors, including age, genetics, environment, lifestyle and coexisting medical conditions. Although some risk factors -- such as age or genes -- cannot be changed, other risk factors -- such as high blood pressure and lack of exercise -- usually can be changed to help reduce risk. Research in these areas may lead to new ways to detect those at highest risk.  Prevention studies A small percentage of people with Alzheimer's disease (less than 1 percent) have an early-onset type associated with genetic mutations. Individuals who have these genetic mutations are guaranteed to develop the disease. An ongoing clinical trial conducted by the [facility] ([facility]), is testing whether antibodies to beta-amyloid can reduce the accumulation of beta-amyloid plaque in the brains of people with such genetic mutations and thereby reduce, delay or prevent symptoms. Participants in the trial are receiving antibodies (or placebo) before they develop symptoms, and the development of beta-amyloid plaques is being monitored by brain scans and other tests.  Another clinical trial, known as the A4 trial (Anti-Amyloid Treatment in Asymptomatic Alzheimer's), is testing whether antibodies to beta-amyloid can reduce the risk of Alzheimer's disease in older people (ages 66 to 22) at high risk for the disease. The A4 trial is being conducted by the Alzheimer's Disease Cooperative Study.  Though research is still evolving, evidence is strong that people can reduce their risk by making key lifestyle changes, including participating in regular activity and maintaining good heart health. Based on this research, the Alzheimer's Association offers 10 Ways to Love Your Brain -- a collection of tips that can reduce the risk of  cognitive decline.  Heart-head connection  New research shows there are things we can do to reduce the risk of mild cognitive impairment and dementia.  Several conditions known to increase the risk of cardiovascular disease -- such as high blood pressure, diabetes and high cholesterol -- also increase the risk of developing Alzheimer's. Some autopsy studies show that as many as 25 percent of individuals with Alzheimer's disease also have cardiovascular disease.  A longstanding question is why some people develop hallmark Alzheimer's plaques and tangles but do not develop the symptoms of Alzheimer's. Vascular disease may help researchers eventually find an answer. Some autopsy studies suggest that plaques and tangles may be present in the brain without causing symptoms of cognitive decline unless the brain also shows evidence of vascular disease. More research is needed to better understand the link between vascular health and Alzheimer's.  Physical exercise  and diet Regular physical exercise may be a beneficial strategy to lower the risk of Alzheimer's and vascular dementia. Exercise may directly benefit brain cells by increasing blood and oxygen flow in the brain. Because of its known cardiovascular benefits, a medically approved exercise program is a valuable part of any overall wellness plan.  Current evidence suggests that heart-healthy eating may also help protect the brain. Heart-healthy eating includes limiting the intake of sugar and saturated fats and making sure to eat plenty of fruits, vegetables, and whole grains. No one diet is best. Two diets that have been studied and may be beneficial are the DASH (Dietary Approaches to Stop Hypertension) diet and the Mediterranean diet. The DASH diet emphasizes vegetables, fruits and fat-free or low-fat dairy products; includes whole grains, fish, poultry, beans, seeds, nuts and vegetable oils; and limits sodium, sweets, sugary beverages and red meats.  A Mediterranean diet includes relatively little red meat and emphasizes whole grains, fruits and vegetables, fish and shellfish, and nuts, olive oil and other healthy fats.  Social connections and intellectual activity A number of studies indicate that maintaining strong social connections and keeping mentally active as we age might lower the risk of cognitive decline and Alzheimer's. Experts are not certain about the reason for this association. It may be due to direct mechanisms through which social and mental stimulation strengthen connections between nerve cells in the brain.  Head trauma There appears to be a strong link between future risk of Alzheimer's and serious head trauma, especially when injury involves loss of consciousness. You can help reduce your risk of Alzheimer's by protecting your head. . Wear a seat belt . Use a helmet when participating in sports . "Fall-proof" your home .  What you can do now While research is not yet conclusive, certain lifestyle choices, such as physical activity and diet, may help support brain health and prevent Alzheimer's. Many of these lifestyle changes have been shown to lower the risk of other diseases, like heart disease and diabetes, which have been linked to Alzheimer's. With few drawbacks and plenty of known benefits, healthy lifestyle choices can improve your health and possibly protect your brain.  Learn more about brain health. You can help increase our knowledge by considering participation in a clinical study. Our free clinical trial matching services, TrialMatch, can help you find clinical trials in your area that are seeking volunteers.  Understanding prevention research Here are some things to keep in mind about the research underlying much of our current knowledge about possible prevention: . Insights about potentially modifiable risk factors apply to large population groups, not to individuals. Studies can show that factor X is  associated with outcome Y, but cannot guarantee that any specific person will have that outcome. As a result, you can "do everything right" and still have a serious health problem or "do everything wrong" and live to be 100. . Much of our current evidence comes from large epidemiological studies such as the [place] Aging Study, the Nurses' Health Study, the Adult Changes in Thought Study and the [employer] These studies explore pre-existing behaviors and use statistical methods to relate those behaviors to health outcomes. This type of study can show an "association" between a factor and an outcome but cannot "prove" cause and effect. This is why we describe evidence based on these studies with such language as "suggests," "may show," "might protect," and "is associated with." . The gold standard for showing cause and effect is a clinical trial in  which participants are randomly assigned to a prevention or risk management strategy or a control group. Researchers follow the two groups over time to see if their outcomes differ significantly. . It is unlikely that some prevention or risk management strategies will ever be tested in randomized trials for ethical or practical reasons. One example is exercise. Definitively testing the impact of exercise on Alzheimer's risk would require a huge trial enrolling thousands of people and following them for many years. The expense and logistics of such a trial would be prohibitive, and it would require some people to go without exercise, a known health benefit.

## 2020-06-23 NOTE — Telephone Encounter (Signed)
Medicare/aarp order sent to [facility]. No auth they will reach out to the patient to schedule.  
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
## 2020-06-24 ENCOUNTER — Encounter (HOSPITAL_COMMUNITY)
Admission: RE | Admit: 2020-06-24 | Discharge: 2020-06-24 | Disposition: A | Discharge status: Home / Self Care | Payer: Medicare Other | Source: Ambulatory Visit | Attending: Orthopaedic Surgery | Admitting: Orthopaedic Surgery

## 2020-06-24 ENCOUNTER — Other Ambulatory Visit: Payer: Self-pay

## 2020-06-24 DIAGNOSIS — M25562 Pain in left knee: Secondary | ICD-10-CM | POA: Insufficient documentation

## 2020-06-24 DIAGNOSIS — M25561 Pain in right knee: Secondary | ICD-10-CM | POA: Insufficient documentation

## 2020-06-24 DIAGNOSIS — G8929 Other chronic pain: Secondary | ICD-10-CM | POA: Diagnosis not present

## 2020-06-24 MED ORDER — TECHNETIUM TC 99M MEDRONATE IV KIT
20.0000 | PACK | Freq: Once | INTRAVENOUS | Status: AC | PRN
  Administered 2020-06-24: 20 via INTRAVENOUS

## 2020-06-26 ENCOUNTER — Ambulatory Visit
Admission: RE | Admit: 2020-06-26 | Discharge: 2020-06-26 | Disposition: A | Discharge status: Home / Self Care | Payer: Medicare Other | Source: Ambulatory Visit | Attending: Neurology | Admitting: Neurology

## 2020-06-26 DIAGNOSIS — R0602 Shortness of breath: Secondary | ICD-10-CM

## 2020-06-26 DIAGNOSIS — R001 Bradycardia, unspecified: Secondary | ICD-10-CM

## 2020-06-26 DIAGNOSIS — I340 Nonrheumatic mitral (valve) insufficiency: Secondary | ICD-10-CM

## 2020-06-26 DIAGNOSIS — T733XXA Exhaustion due to excessive exertion, initial encounter: Secondary | ICD-10-CM

## 2020-06-26 DIAGNOSIS — R413 Other amnesia: Secondary | ICD-10-CM

## 2020-06-28 ENCOUNTER — Telehealth: Payer: Self-pay | Admitting: Neurology

## 2020-06-28 NOTE — Telephone Encounter (Signed)
Called the patient and reviewed the MRI findings in detail with him. Pt verbalized understanding. Pt had no questions at this time but was encouraged to call back if questions arise.

## 2020-06-28 NOTE — Progress Notes (Signed)
There is mild generalized cortical atrophy, little more than typical for age.  Parietal lobes are affected the most.  There are no abnormal extra-axial collections of fluid.  Scattered T2/FLAIR hyperintense foci in the hemispheres most consistent with mild chronic microvascular ischemic changes.There were no acute findings

## 2020-06-28 NOTE — Telephone Encounter (Signed)
-----   Message from Larey Seat, MD sent at 06/28/2020 12:10 PM EST ----- There is mild generalized cortical atrophy, little more than typical for age.  Parietal lobes are affected the most.  There are no abnormal extra-axial collections of fluid.  Scattered T2/FLAIR hyperintense foci in the hemispheres most consistent with mild chronic microvascular ischemic changes.There were no acute findings

## 2020-06-29 ENCOUNTER — Ambulatory Visit (INDEPENDENT_AMBULATORY_CARE_PROVIDER_SITE_OTHER): Payer: Medicare Other | Admitting: Orthopaedic Surgery

## 2020-06-29 ENCOUNTER — Encounter: Payer: Self-pay | Admitting: Dermatology

## 2020-06-29 ENCOUNTER — Other Ambulatory Visit: Payer: Self-pay

## 2020-06-29 ENCOUNTER — Encounter: Payer: Self-pay | Admitting: Orthopaedic Surgery

## 2020-06-29 ENCOUNTER — Ambulatory Visit (INDEPENDENT_AMBULATORY_CARE_PROVIDER_SITE_OTHER): Payer: Medicare Other | Admitting: Dermatology

## 2020-06-29 DIAGNOSIS — G8929 Other chronic pain: Secondary | ICD-10-CM

## 2020-06-29 DIAGNOSIS — Z1283 Encounter for screening for malignant neoplasm of skin: Secondary | ICD-10-CM

## 2020-06-29 DIAGNOSIS — D485 Neoplasm of uncertain behavior of skin: Secondary | ICD-10-CM | POA: Diagnosis not present

## 2020-06-29 DIAGNOSIS — D1800 Hemangioma unspecified site: Secondary | ICD-10-CM | POA: Diagnosis not present

## 2020-06-29 DIAGNOSIS — Z96652 Presence of left artificial knee joint: Secondary | ICD-10-CM | POA: Diagnosis not present

## 2020-06-29 DIAGNOSIS — M25562 Pain in left knee: Secondary | ICD-10-CM | POA: Diagnosis not present

## 2020-06-29 DIAGNOSIS — L821 Other seborrheic keratosis: Secondary | ICD-10-CM | POA: Diagnosis not present

## 2020-06-29 DIAGNOSIS — Z85820 Personal history of malignant melanoma of skin: Secondary | ICD-10-CM

## 2020-06-29 DIAGNOSIS — M25561 Pain in right knee: Secondary | ICD-10-CM

## 2020-06-29 DIAGNOSIS — Z96651 Presence of right artificial knee joint: Secondary | ICD-10-CM | POA: Diagnosis not present

## 2020-06-29 NOTE — Addendum Note (Signed)
Addended by: Robyne Peers on: 06/29/2020 11:37 AM   Modules accepted: Orders

## 2020-06-29 NOTE — Progress Notes (Signed)
The patient comes in today to go over the three-phase bone scan of both his knees.  He has a remote history of bilateral knee [place].  He now lives in [place].  He is an active 74 year old gentleman has been having some knee pain and weakness.  The three-phase bone scans showed no evidence of prosthetic loosening.  On my exam of both knees there is no effusion.  Both knees have good range of motion and feel stable.  I went over the bone scan findings with him.  I do have information from his knee replacements that they are DePuy knees.  I would like to send him to outpatient physical therapy to strengthen the muscles around his knees.  I will then see him back in 6 weeks.  If he is continue to have discomfort we may consider an open arthrotomy of the knee is hurting in the worst and upsizing or changing out the poly if needed given that he may be having just some mild synovitis of polywear with having knee replacements that have been in for over 20 years.  He agrees with this treatment plan.  We will work on setting him up for outpatient physical therapy for his knees.

## 2020-06-29 NOTE — Patient Instructions (Signed)

## 2020-07-04 ENCOUNTER — Encounter: Payer: Self-pay | Admitting: Dermatology

## 2020-07-04 NOTE — Progress Notes (Signed)
   Follow-Up Visit   Subjective  Michael Pace is a 74 y.o. male who presents for the following: Annual Exam (CHEST NONHEALING).  General skin examination Location: Left chest Duration:  Quality:  Associated Signs/Symptoms: Modifying Factors:  Severity:  Timing: Context: History of melanoma  Objective  Well appearing patient in no apparent distress; mood and affect are within normal limits. Objective  Chest - Medial [facility]): Distant history of melanoma.  No sign of atypical mole or melanoma on general skin examination.  Objective  Left Breast: Pink pearly papule 7 mm; superficial carcinoma versus irritated keratosis.     Objective  Right Abdomen (side) - Upper: Red raised bump   A full examination was performed including scalp, head, eyes, ears, nose, lips, neck, chest, axillae, abdomen, back, buttocks, bilateral upper extremities, bilateral lower extremities, hands, feet, fingers, toes, fingernails, and toenails. All findings within normal limits unless otherwise noted below.   Assessment & Plan    Screening exam for skin cancer Chest - Medial [facility])  Yearly skin check; encouraged to examine his own skin twice annually.  Neoplasm of uncertain behavior of skin Left Breast  Skin / nail biopsy Type of biopsy: tangential   Informed consent: discussed and consent obtained   Timeout: patient name, date of birth, surgical site, and procedure verified   Procedure prep:  Patient was prepped and draped in usual sterile fashion (Non sterile) Prep type:  Chlorhexidine Anesthesia: the lesion was anesthetized in a standard fashion   Anesthetic:  1% lidocaine w/ epinephrine 1-100,000 local infiltration Instrument used: flexible razor blade   Outcome: patient tolerated procedure well   Post-procedure details: wound care instructions given    Specimen 1 - Surgical pathology Differential Diagnosis: bcc vs scc Check Margins: No  Hemangioma, unspecified site Right  Abdomen (side) - Upper  No need to remove if stable     I, Lavonna Monarch, MD, have reviewed all documentation for this visit.  The documentation on 07/04/20 for the exam, diagnosis, procedures, and orders are all accurate and complete.

## 2020-07-14 ENCOUNTER — Other Ambulatory Visit: Payer: Self-pay

## 2020-07-14 ENCOUNTER — Ambulatory Visit: Payer: Medicare Other | Attending: Orthopaedic Surgery

## 2020-07-14 DIAGNOSIS — M6281 Muscle weakness (generalized): Secondary | ICD-10-CM | POA: Insufficient documentation

## 2020-07-14 DIAGNOSIS — M25651 Stiffness of right hip, not elsewhere classified: Secondary | ICD-10-CM | POA: Diagnosis not present

## 2020-07-14 DIAGNOSIS — M25652 Stiffness of left hip, not elsewhere classified: Secondary | ICD-10-CM | POA: Diagnosis not present

## 2020-07-14 DIAGNOSIS — M25562 Pain in left knee: Secondary | ICD-10-CM | POA: Diagnosis not present

## 2020-07-14 DIAGNOSIS — G8929 Other chronic pain: Secondary | ICD-10-CM | POA: Diagnosis not present

## 2020-07-14 DIAGNOSIS — M25561 Pain in right knee: Secondary | ICD-10-CM | POA: Insufficient documentation

## 2020-07-14 NOTE — Patient Instructions (Addendum)
Bridge, SLR, LTR, hamstring stretch   3-10 reps RT and LT  Hold 3-5 sec, 2x/day

## 2020-07-14 NOTE — Therapy (Signed)
[place], [place], [address] Phone: [phone]   Fax:  [phone]  Physical Therapy Evaluation  Patient Details  Name: Michael Pace MRN: [id number] Date of Birth: [birth year] Referring Provider (PT): Jean Rosenthal   MD   Encounter Date: 07/14/2020   PT End of Session - 07/14/20 1512    Visit Number 1    Number of Visits 12    Date for PT Re-Evaluation 08/26/20    Authorization Type MCR Lynda [place]water    PT Start Time 0310    PT Stop Time 0350    PT Time Calculation (min) 40 min    Activity Tolerance Patient tolerated treatment well    Behavior During Therapy [facility] for tasks assessed/performed           Past Medical History:  Diagnosis Date  . BPH (benign prostatic hyperplasia)   . Cataracts, bilateral   . GERD (gastroesophageal reflux disease)   . Melanoma ([place]) [place]  . Squamous cell carcinoma of skin 03/29/2016   RIGHT SIDEBURN TX= CX3 5FU    Past Surgical History:  Procedure Laterality Date  . SKIN CANCER EXCISION     Melanoma  . TOTAL KNEE ARTHROPLASTY     bilateral    There were no vitals filed for this visit.    Subjective Assessment - 07/14/20 1515    Subjective He reports LT knee pain. He has history of LT knee TKA (20 years) . Walking is getting harder with pain and fatigue.    Pertinent History Bilateral TKA.    Limitations Walking    How long can you walk comfortably? 30 min fatigued    Diagnostic tests Everything looks good    Patient Stated Goals improve    walking tolerance , improve flexibility  improve strength , decr pain    Currently in Pain? No/denies    Pain Score 4    post walking   Pain Location Knee    Pain Orientation Right;Left;Anterior    Pain Descriptors / Indicators --   a pain   Pain Type Chronic pain    Pain Onset More than a month ago    Pain Frequency Intermittent    Aggravating Factors  walking    Pain Relieving  Factors rest              [facility] PT Assessment - 07/14/20 0001      Assessment   Medical Diagnosis Lt knee pain    Referring Provider (PT) Jean Rosenthal   MD    Prior Therapy Post TKA      Precautions   Precautions None      Restrictions   Weight Bearing Restrictions No      Balance Screen   Has the patient fallen in the past 6 months Yes    How many times? 2   dizziness, decr balance   Has the patient had a decrease in activity level because of a fear of falling?  No    Is the patient reluctant to leave their home because of a fear of falling?  No      Home Environment   Living Environment Private residence    Living Arrangements Spouse/significant other    Type of [place] to enter    Entrance Stairs-Number of Steps 2    Entrance Stairs-Rails Right    [place] One level      Prior  Function   Level of Independence Independent      Cognition   Overall Cognitive Status Within Functional Limits for tasks assessed      ROM / Strength   AROM / PROM / Strength AROM;Strength;PROM      AROM   AROM Assessment Site Hip;Knee    Right/Left Knee Right;Left    Right Knee Extension -5    Right Knee Flexion 130    Left Knee Flexion 135      PROM   Overall PROM Comments RT hip ext decreased      Strength   Strength Assessment Site Hip;Knee    Right Hip Flexion 5/5    Right/Left Knee Right;Left    Right Knee Flexion 5/5    Right Knee Extension 5/5    Left Knee Flexion 5/5    Left Knee Extension 5/5      Flexibility   Soft Tissue Assessment /Muscle Length yes    Hamstrings Lt 45 degres RT 60 degrees    Quadriceps LT  tight prone    ITB = Obers bilaterally                      Objective measurements completed on examination: See above findings.               PT Education - 07/14/20 1515    Education Details POC HEP FOTO score and possible improvement    Person(s) Educated Patient    Methods Explanation     Comprehension Verbalized understanding            PT Short Term Goals - 07/14/20 1611      PT SHORT TERM GOAL #1   Title He will have der pain with walking 30 min    Time 3    Period Weeks    Status New      PT SHORT TERM GOAL #2   Title He will be indpendent with initial HEP issued    Time 3    Period Weeks    Status New      PT SHORT TERM GOAL #3   Title He will improve FOTO by 5 points    Time 3    Period Weeks    Status New             PT Long Term Goals - 07/14/20 1612      PT LONG TERM GOAL #1   Title He will be indpendent with all hEP issued    Time 6    Period Weeks    Status New      PT LONG TERM GOAL #2   Title He will improve FOTO score to 68 %    Time 6    Period Weeks    Status New      PT LONG TERM GOAL #3   Title He will be able to walk for 45 min with onlu mild knee pain and fatigue.    Time 6    Period Weeks    Status New      PT LONG TERM GOAL #4   Title Hip strength will improve to 4+/5 to improve walking tolerance.    Time 6    Period Weeks    Status New      PT LONG TERM GOAL #5   Title Hewill report geneally pain 50% improved with activity on feet around home tasks    Time 6    Period Weeks  Status New                  Plan - 07/14/20 1512    Clinical Impression Statement MR Frye presents 20 years post TKA bilaterally with  reports of pain and increased fatigue with walking. About a year ago he was able to walk for an hour and now it is down to 30 min.  On exam he has tightness in both hips and quads and weakness of hips.   Quads and hams gave normal resistance.   He should improve with skilled PT an d consistent HEP    Personal Factors and Comorbidities Age;Time since onset of injury/illness/exacerbation    Examination-Activity Limitations Squat;Stairs;Locomotion Level    Examination-Participation Restrictions Community Activity    Stability/Clinical Decision Making Stable/Uncomplicated    Clinical  Decision Making Low    Rehab Potential Fair    PT Frequency 2x / week    PT Duration 6 weeks    PT Treatment/Interventions Passive range of motion;Manual techniques;Balance training;Therapeutic exercise;Therapeutic activities;Gait training;Taping;Cryotherapy;Patient/family education    PT Next Visit Plan Reveiew HEP and add for hip strength and ROm .  Manual for ROM,  Check ankle ROM    PT Home Exercise Plan bridge, supine SLR, hamstring stretch, LTR    Consulted and Agree with Plan of Care Patient           Patient will benefit from skilled therapeutic intervention in order to improve the following deficits and impairments:  Pain,Decreased activity tolerance,Decreased strength  Visit Diagnosis: Chronic pain of right knee  Chronic pain of left knee  Stiffness of right hip, not elsewhere classified  Stiffness of left hip, not elsewhere classified  Muscle weakness (generalized)     Problem List Patient Active Problem List   Diagnosis Date Noted  . History of total right knee replacement 06/07/2020  . History of total left knee replacement 06/07/2020  . Educated about COVID-19 virus infection 04/06/2020  . Dilated aortic root ([place]) 04/06/2020  . Bradycardia 01/06/2020  . Fatigue 01/06/2020  . SOB (shortness of breath) 01/06/2020  . Nonspecific abnormal electrocardiogram (ECG) (EKG) 01/06/2020  . Nonrheumatic mitral valve regurgitation 01/06/2020    Darrel Hoover  PT 07/14/2020, 4:16 PM  [facility] [address] [place], [place], [address] Phone: [phone]   Fax:  [phone]  Name: Terryon Pineiro MRN: [id number] Date of Birth: 12-Nov-1945

## 2020-07-27 ENCOUNTER — Ambulatory Visit (INDEPENDENT_AMBULATORY_CARE_PROVIDER_SITE_OTHER): Payer: Medicare Other | Admitting: Neurology

## 2020-07-27 DIAGNOSIS — T733XXA Exhaustion due to excessive exertion, initial encounter: Secondary | ICD-10-CM

## 2020-07-27 DIAGNOSIS — G4733 Obstructive sleep apnea (adult) (pediatric): Secondary | ICD-10-CM | POA: Diagnosis not present

## 2020-07-27 DIAGNOSIS — I340 Nonrheumatic mitral (valve) insufficiency: Secondary | ICD-10-CM

## 2020-07-27 DIAGNOSIS — R413 Other amnesia: Secondary | ICD-10-CM

## 2020-07-27 DIAGNOSIS — R0602 Shortness of breath: Secondary | ICD-10-CM

## 2020-07-27 DIAGNOSIS — R001 Bradycardia, unspecified: Secondary | ICD-10-CM

## 2020-08-02 ENCOUNTER — Encounter: Payer: Self-pay | Admitting: Physical Therapy

## 2020-08-02 ENCOUNTER — Other Ambulatory Visit: Payer: Self-pay

## 2020-08-02 ENCOUNTER — Ambulatory Visit: Payer: Medicare Other | Attending: Orthopaedic Surgery | Admitting: Physical Therapy

## 2020-08-02 DIAGNOSIS — M25562 Pain in left knee: Secondary | ICD-10-CM | POA: Diagnosis not present

## 2020-08-02 DIAGNOSIS — M6281 Muscle weakness (generalized): Secondary | ICD-10-CM | POA: Insufficient documentation

## 2020-08-02 DIAGNOSIS — M25651 Stiffness of right hip, not elsewhere classified: Secondary | ICD-10-CM | POA: Diagnosis not present

## 2020-08-02 DIAGNOSIS — M25561 Pain in right knee: Secondary | ICD-10-CM | POA: Diagnosis not present

## 2020-08-02 DIAGNOSIS — G8929 Other chronic pain: Secondary | ICD-10-CM | POA: Insufficient documentation

## 2020-08-02 DIAGNOSIS — M25652 Stiffness of left hip, not elsewhere classified: Secondary | ICD-10-CM | POA: Diagnosis not present

## 2020-08-02 NOTE — Therapy (Signed)
[place], [place], [address] Phone: [phone]   Fax:  [phone]  Physical Therapy Treatment  Patient Details  Name: Michael Pace MRN: [id number] Date of Birth: 1946/07/04 Referring Provider (PT): Jean Rosenthal   MD   Encounter Date: 08/02/2020   PT End of Session - 08/02/20 0928    Visit Number 2    Number of Visits 12    Date for PT Re-Evaluation 08/26/20    Authorization Type MCR Lynda Rainwater    PT Start Time 0920    PT Stop Time 1003    PT Time Calculation (min) 43 min    Activity Tolerance Patient tolerated treatment well    Behavior During Therapy [facility] for tasks assessed/performed           Past Medical History:  Diagnosis Date  . BPH (benign prostatic hyperplasia)   . Cataracts, bilateral   . GERD (gastroesophageal reflux disease)   . Melanoma ([place]) [place]  . Squamous cell carcinoma of skin 03/29/2016   RIGHT SIDEBURN TX= CX3 5FU    Past Surgical History:  Procedure Laterality Date  . SKIN CANCER EXCISION     Melanoma  . TOTAL KNEE ARTHROPLASTY     bilateral    There were no vitals filed for this visit.   Subjective Assessment - 08/02/20 0928    Subjective I'm OK today.  No complains or changes in status.    Currently in Pain? No/denies               [facility] Adult PT Treatment/Exercise - 08/02/20 0001      Knee/Hip Exercises: Stretches   Active Hamstring Stretch Both;3 reps;30 seconds    Other Knee/Hip Stretches knees crossed with trunk rotation x  3      Knee/Hip Exercises: Aerobic   Nustep L5 Ue and LE for 5 min      Knee/Hip Exercises: Seated   Sit to Sand 10 reps   green band     Knee/Hip Exercises: Supine   Bridges Both;1 set;10 reps    Straight Leg Raises Both;1 set;10 reps      Knee/Hip Exercises: Sidelying   Clams green band x 15                  PT Education - 08/02/20 1111    Education Details HEP  corrections, techniques    Person(s) Educated Patient    Methods Explanation;Handout    Comprehension Verbalized understanding;Returned demonstration;Tactile cues required            PT Short Term Goals - 08/02/20 1112      PT SHORT TERM GOAL #1   Title He will have decreased pain with walking 30 min    Status On-going      PT SHORT TERM GOAL #2   Title He will be indpendent with initial HEP issued    Status Achieved      PT SHORT TERM GOAL #3   Title He will improve FOTO by 5 points    Status On-going             PT Long Term Goals - 07/14/20 1612      PT LONG TERM GOAL #1   Title He will be indpendent with all hEP issued    Time 6    Period Weeks    Status New      PT LONG TERM GOAL #2   Title He  will improve FOTO score to 68 %    Time 6    Period Weeks    Status New      PT LONG TERM GOAL #3   Title He will be able to walk for 45 min with onlu mild knee pain and fatigue.    Time 6    Period Weeks    Status New      PT LONG TERM GOAL #4   Title Hip strength will improve to 4+/5 to improve walking tolerance.    Time 6    Period Weeks    Status New      PT LONG TERM GOAL #5   Title Hewill report geneally pain 50% improved with activity on feet around home tasks    Time 6    Period Weeks    Status New                 Plan - 08/02/20 1497    Clinical Impression Statement Patient able to tolerate exercises well with cues, corrections to maximize benefit for Lt knee strength.  Added some light hip strengthening exercises today. Pt with many questions regarding form and rationale requiring increased time.    PT Treatment/Interventions Passive range of motion;Manual techniques;Balance training;Therapeutic exercise;Therapeutic activities;Gait training;Taping;Cryotherapy;Patient/family education    PT Next Visit Plan Cone hip strength and ROM .check ankle ROM, standing as tolerated, NuStep    PT Home Exercise Plan [email or web address]    Consulted and Agree with  Plan of Care Patient           Patient will benefit from skilled therapeutic intervention in order to improve the following deficits and impairments:  Pain,Decreased activity tolerance,Decreased strength  Visit Diagnosis: Chronic pain of right knee  Chronic pain of left knee  Stiffness of right hip, not elsewhere classified  Stiffness of left hip, not elsewhere classified  Muscle weakness (generalized)     Problem List Patient Active Problem List   Diagnosis Date Noted  . History of total right knee replacement 06/07/2020  . History of total left knee replacement 06/07/2020  . Educated about COVID-19 virus infection 04/06/2020  . Dilated aortic root ([place]) 04/06/2020  . Bradycardia 01/06/2020  . Fatigue 01/06/2020  . SOB (shortness of breath) 01/06/2020  . Nonspecific abnormal electrocardiogram (ECG) (EKG) 01/06/2020  . Nonrheumatic mitral valve regurgitation 01/06/2020    Falisha Osment 08/02/2020, 11:15 AM  [facility] [address] [place], [place], [address] Phone: [phone]   Fax:  [phone]  Name: Michael Pace MRN: [id number] Date of Birth: [birth year]  Raeford Razor, PT 08/02/20 11:15 AM Phone: [phone] Fax: [phone]

## 2020-08-03 DIAGNOSIS — G4733 Obstructive sleep apnea (adult) (pediatric): Secondary | ICD-10-CM | POA: Insufficient documentation

## 2020-08-03 DIAGNOSIS — R413 Other amnesia: Secondary | ICD-10-CM | POA: Insufficient documentation
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
## 2020-08-03 NOTE — Progress Notes (Signed)
IMPRESSION: This HST confirmed the presence of moderate OSA (obstructive sleep apnea) at an AHI of 20/h without hypoxia, tachycardia and only moderate exacerbation during REM sleep.    RECOMMENDATION: The Treatment of this apnea form can include a dental device or CPAP treatment. No an inspire candidate by BMI.  I recommend use of CPAP autotitration device with a setting from 6 through 16 cm water, 2 cm EPR, heated humidification and a mask of patient's comfort and choice.

## 2020-08-03 NOTE — Addendum Note (Signed)
Addended by: Larey Seat on: 08/03/2020 05:45 PM   Modules accepted: Orders
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
## 2020-08-04 ENCOUNTER — Other Ambulatory Visit: Payer: Self-pay

## 2020-08-04 ENCOUNTER — Ambulatory Visit: Payer: Medicare Other

## 2020-08-04 DIAGNOSIS — G8929 Other chronic pain: Secondary | ICD-10-CM | POA: Diagnosis not present

## 2020-08-04 DIAGNOSIS — M25651 Stiffness of right hip, not elsewhere classified: Secondary | ICD-10-CM | POA: Diagnosis not present

## 2020-08-04 DIAGNOSIS — M25562 Pain in left knee: Secondary | ICD-10-CM | POA: Diagnosis not present

## 2020-08-04 DIAGNOSIS — M25561 Pain in right knee: Secondary | ICD-10-CM | POA: Diagnosis not present

## 2020-08-04 DIAGNOSIS — M6281 Muscle weakness (generalized): Secondary | ICD-10-CM

## 2020-08-04 DIAGNOSIS — M25652 Stiffness of left hip, not elsewhere classified: Secondary | ICD-10-CM | POA: Diagnosis not present

## 2020-08-04 NOTE — Therapy (Signed)
Michael Pace, [place], [address] Phone: [phone]   Fax:  [phone]  Physical Therapy Treatment  Patient Details  Name: Michael Pace MRN: [id number] Date of Birth: 01-31-1946 Referring Provider (PT): Jean Rosenthal   MD   Encounter Date: 08/04/2020   PT End of Session - 08/04/20 0959    Visit Number 3    Number of Visits 12    Date for PT Re-Evaluation 08/26/20    Authorization Type MCR Lynda Rainwater    PT Start Time 1000    PT Stop Time 1040    PT Time Calculation (min) 40 min    Activity Tolerance Patient tolerated treatment well    Behavior During Therapy [facility] for tasks assessed/performed           Past Medical History:  Diagnosis Date  . BPH (benign prostatic hyperplasia)   . Cataracts, bilateral   . GERD (gastroesophageal reflux disease)   . Melanoma ([place]) [place]  . Squamous cell carcinoma of skin 03/29/2016   RIGHT SIDEBURN TX= CX3 5FU    Past Surgical History:  Procedure Laterality Date  . SKIN CANCER EXCISION     Melanoma  . TOTAL KNEE ARTHROPLASTY     bilateral    There were no vitals filed for this visit.   Subjective Assessment - 08/04/20 1003    Subjective Pt reponds "okay" when asked how he is feeling upon arrival. He states he tends to get short of breath with exertion/increased activity.    Pertinent History Bilateral TKA.    Limitations Walking    How long can you walk comfortably? 30 min fatigued    Diagnostic tests Everything looks good    Patient Stated Goals improve    walking tolerance , improve flexibility  improve strength , decr pain    Currently in Pain? No/denies    Pain Onset More than a month ago              [facility] PT Assessment - 08/04/20 0001      Assessment   Medical Diagnosis Lt knee pain    Referring Provider (PT) Jean Rosenthal   MD                         [facility] Adult PT Treatment/Exercise -  08/04/20 0001      Self-Care   Self-Care Other Self-Care Comments    Other Self-Care Comments  HEP and discussed updating HEP next session pending response to this session. Reiterated optimal form/technique with interventions.      Knee/Hip Exercises: Stretches   Active Hamstring Stretch Right;2 reps;30 seconds    Active Hamstring Stretch Limitations supine with green strap for 1 rep then seated at EOM for 1 rep      Knee/Hip Exercises: Aerobic   Nustep L5 x 5 min LE only      Knee/Hip Exercises: Standing   Hip Abduction Stengthening;Both;2 sets;10 reps;Knee straight    Hip Extension Stengthening;Both;2 sets;10 reps;Knee straight      Knee/Hip Exercises: Seated   Long Arc Quad Strengthening;Both;1 set;10 reps    Long Arc Quad Limitations 3 sec hold at end range knee EXT    Clamshell with TheraBand Green   15x   Marching Strengthening;Both;15 reps    Marching Limitations 15x each LE    Hamstring Curl Strengthening;Both;10 reps    Hamstring Limitations green theraband for resistance    Sit to  Sand 2 sets;15 reps;without UE support      Knee/Hip Exercises: Supine   Bridges with [employer] Squeeze Strengthening;Both;15 reps    Straight Leg Raises Strengthening;Both;15 reps                  PT Education - 08/04/20 1206    Education Details HEP and discussed updating HEP next session pending response to this session. Reiterated optimal form/technique with interventions.    Person(s) Educated Patient    Methods Explanation;Demonstration;Verbal cues    Comprehension Verbalized understanding;Returned demonstration            PT Short Term Goals - 08/02/20 1112      PT SHORT TERM GOAL #1   Title He will have decreased pain with walking 30 min    Status On-going      PT SHORT TERM GOAL #2   Title He will be indpendent with initial HEP issued    Status Achieved      PT SHORT TERM GOAL #3   Title He will improve FOTO by 5 points    Status On-going             PT  Long Term Goals - 07/14/20 1612      PT LONG TERM GOAL #1   Title He will be indpendent with all hEP issued    Time 6    Period Weeks    Status New      PT LONG TERM GOAL #2   Title He will improve FOTO score to 68 %    Time 6    Period Weeks    Status New      PT LONG TERM GOAL #3   Title He will be able to walk for 45 min with onlu mild knee pain and fatigue.    Time 6    Period Weeks    Status New      PT LONG TERM GOAL #4   Title Hip strength will improve to 4+/5 to improve walking tolerance.    Time 6    Period Weeks    Status New      PT LONG TERM GOAL #5   Title Hewill report geneally pain 50% improved with activity on feet around home tasks    Time 6    Period Weeks    Status New                 Plan - 08/04/20 1000    Clinical Impression Statement Patient tolerated therapy session well with no adverse effects or complaints of increased pain. Pt required frequent brief rest breaks due to DOE so he could performed DBE between sets and interventions. He was able to tolerate standing hip ABD and EXT well. Cues provided during interventions for optimal technique/form. He states he has increased pain with L knee FL compared to R knee but reports it does not bother him much if he does not maintain position for a prolonged time period.    Personal Factors and Comorbidities Age;Time since onset of injury/illness/exacerbation    Examination-Activity Limitations Squat;Stairs;Locomotion Level    Examination-Participation Restrictions Community Activity    PT Treatment/Interventions Passive range of motion;Manual techniques;Balance training;Therapeutic exercise;Therapeutic activities;Gait training;Taping;Cryotherapy;Patient/family education    PT Next Visit Plan Continue BLE strengthening and progress CKC as tolerated. Hip FL stretch    PT Home Exercise Plan [email or web address]    Consulted and Agree with Plan of Care Patient  Patient will benefit from skilled  therapeutic intervention in order to improve the following deficits and impairments:  Pain,Decreased activity tolerance,Decreased strength  Visit Diagnosis: Chronic pain of right knee  Chronic pain of left knee  Stiffness of right hip, not elsewhere classified  Stiffness of left hip, not elsewhere classified  Muscle weakness (generalized)     Problem List Patient Active Problem List   Diagnosis Date Noted  . Short-term memory loss 08/03/2020  . Moderate obstructive sleep apnea-hypopnea syndrome 08/03/2020  . History of total right knee replacement 06/07/2020  . History of total left knee replacement 06/07/2020  . Educated about COVID-19 virus infection 04/06/2020  . Dilated aortic root ([place]) 04/06/2020  . Bradycardia 01/06/2020  . Fatigue 01/06/2020  . SOB (shortness of breath) 01/06/2020  . Nonspecific abnormal electrocardiogram (ECG) (EKG) 01/06/2020  . Nonrheumatic mitral valve regurgitation 01/06/2020    Haydee Monica, PT, DPT 08/04/20 12:08 PM  [facility] [address] [place], [place], [address] Phone: [phone]   Fax:  [phone]  Name: Michael Pace MRN: [id number] Date of Birth: 1945/08/29

## 2020-08-09 ENCOUNTER — Other Ambulatory Visit: Payer: Self-pay

## 2020-08-09 ENCOUNTER — Ambulatory Visit: Payer: Medicare Other

## 2020-08-09 ENCOUNTER — Telehealth: Payer: Self-pay | Admitting: Neurology

## 2020-08-09 DIAGNOSIS — G8929 Other chronic pain: Secondary | ICD-10-CM | POA: Diagnosis not present

## 2020-08-09 DIAGNOSIS — M25652 Stiffness of left hip, not elsewhere classified: Secondary | ICD-10-CM

## 2020-08-09 DIAGNOSIS — R0602 Shortness of breath: Secondary | ICD-10-CM

## 2020-08-09 DIAGNOSIS — M25562 Pain in left knee: Secondary | ICD-10-CM | POA: Diagnosis not present

## 2020-08-09 DIAGNOSIS — M25651 Stiffness of right hip, not elsewhere classified: Secondary | ICD-10-CM

## 2020-08-09 DIAGNOSIS — M25561 Pain in right knee: Secondary | ICD-10-CM | POA: Diagnosis not present

## 2020-08-09 DIAGNOSIS — M6281 Muscle weakness (generalized): Secondary | ICD-10-CM | POA: Diagnosis not present

## 2020-08-09 DIAGNOSIS — T733XXA Exhaustion due to excessive exertion, initial encounter: Secondary | ICD-10-CM

## 2020-08-09 DIAGNOSIS — G4733 Obstructive sleep apnea (adult) (pediatric): Secondary | ICD-10-CM

## 2020-08-09 NOTE — Telephone Encounter (Signed)
-----   Message from Larey Seat, MD sent at 08/03/2020  5:45 PM EST ----- IMPRESSION: This HST confirmed the presence of moderate OSA (obstructive sleep apnea) at an AHI of 20/h without hypoxia, tachycardia and only moderate exacerbation during REM sleep.    RECOMMENDATION: The Treatment of this apnea form can include a dental device or CPAP treatment. No an inspire candidate by BMI.  I recommend use of CPAP autotitration device with a setting from 6 through 16 cm water, 2 cm EPR, heated humidification and a mask of patient's comfort and choice.

## 2020-08-09 NOTE — Telephone Encounter (Signed)
Called the patient and advised that the pt on the sleep study results. Informed the patient of the findings and what Dr Brett Fairy recommends for treatment. Pt has agreed to a dental device. Advised I would send a referral for him. He has asked they contact his wifes number for scheduling and her number is [phone] Mrs. Schlermmer. Pt verbalized understanding. Pt had no questions at this time but was encouraged to call back if questions arise.

## 2020-08-09 NOTE — Therapy (Signed)
[place], [place], [address] Phone: [phone]   Fax:  [phone]  Physical Therapy Treatment  Patient Details  Name: Joshia Kitchings MRN: [id number] Date of Birth: 03/11/1946 Referring Provider (PT): Jean Rosenthal   MD   Encounter Date: 08/09/2020   PT End of Session - 08/09/20 0959    Visit Number 4    Number of Visits 12    Date for PT Re-Evaluation 08/26/20    Authorization Type MCR Lynda Rainwater    PT Start Time 1000    PT Stop Time 1045    PT Time Calculation (min) 45 min    Activity Tolerance Patient tolerated treatment well    Behavior During Therapy [facility] for tasks assessed/performed           Past Medical History:  Diagnosis Date  . BPH (benign prostatic hyperplasia)   . Cataracts, bilateral   . GERD (gastroesophageal reflux disease)   . Melanoma ([place]) [place]  . Squamous cell carcinoma of skin 03/29/2016   RIGHT SIDEBURN TX= CX3 5FU    Past Surgical History:  Procedure Laterality Date  . SKIN CANCER EXCISION     Melanoma  . TOTAL KNEE ARTHROPLASTY     bilateral    There were no vitals filed for this visit.   Subjective Assessment - 08/09/20 1000    Subjective Pt reports having some soreness in both knees as if he climbed stairs but denies pain. He states he had some increased soreness after performing sit to stands but that it tapered off.    Pertinent History Bilateral TKA.    Limitations Walking    How long can you walk comfortably? 30 min fatigued    Diagnostic tests Everything looks good    Patient Stated Goals improve    walking tolerance , improve flexibility  improve strength , decr pain    Currently in Pain? No/denies    Pain Onset More than a month ago              [facility] PT Assessment - 08/09/20 0001      Assessment   Medical Diagnosis Lt knee pain    Referring Provider (PT) Jean Rosenthal   MD                [facility]  Adult PT Treatment/Exercise - 08/09/20 0001      Knee/Hip Exercises: Aerobic   Nustep L5 x 5 min LE only      Knee/Hip Exercises: Machines for Strengthening   Cybex Knee Extension 25# 2 x 15    Cybex Knee Flexion 45# 2 x 15    Cybex Leg Press 75# 2 x 15      Knee/Hip Exercises: Standing   Heel Raises Both;20 reps    Hip Abduction Stengthening;Both;2 sets;10 reps;Knee straight    Hip Extension Stengthening;Both;2 sets;15 reps;Knee straight    Forward Step Up Right;Left;20 reps    Forward Step Up Limitations 10x leading with RLE and 10x leading with LLE      Knee/Hip Exercises: Supine   Bridges with [employer] Strengthening;Both;2 sets;10 reps      Knee/Hip Exercises: Sidelying   Clams Attempted 2 repetitions with blue band at end of session prior to providing patient with blue theraband for home due to pt reporting increased ease with green band                  PT Education - 08/09/20  1155    Education Details Reviewed and updated HEP.    Person(s) Educated Patient    Methods Explanation;Demonstration;Verbal cues    Comprehension Verbalized understanding;Returned demonstration            PT Short Term Goals - 08/09/20 1154      PT SHORT TERM GOAL #1   Title He will have decreased pain with walking 30 min    Status On-going      PT SHORT TERM GOAL #2   Title He will be indpendent with initial HEP issued    Status Achieved      PT SHORT TERM GOAL #3   Title He will improve FOTO by 5 points    Baseline Will reassess at visit 6    Status On-going             PT Long Term Goals - 07/14/20 1612      PT LONG TERM GOAL #1   Title He will be indpendent with all hEP issued    Time 6    Period Weeks    Status New      PT LONG TERM GOAL #2   Title He will improve FOTO score to 68 %    Time 6    Period Weeks    Status New      PT LONG TERM GOAL #3   Title He will be able to walk for 45 min with onlu mild knee pain and fatigue.    Time 6    Period  Weeks    Status New      PT LONG TERM GOAL #4   Title Hip strength will improve to 4+/5 to improve walking tolerance.    Time 6    Period Weeks    Status New      PT LONG TERM GOAL #5   Title Hewill report geneally pain 50% improved with activity on feet around home tasks    Time 6    Period Weeks    Status New                 Plan - 08/09/20 1008    Clinical Impression Statement Patient tolerated therapy session well with no adverse effects or complaints of pain. He was able to perform exercises in standing and with increased resistance today with frequent brief rest breaks due to DOE so he could perform DBE between sets and interventions, but he denies pain. Pt is progressing well and will continue to benefit from skilled PT intervention to increase strength and endurance to allow for improved tolerance with functional tasks.    Personal Factors and Comorbidities Age;Time since onset of injury/illness/exacerbation    Examination-Activity Limitations Squat;Stairs;Locomotion Level    Examination-Participation Restrictions Community Activity    PT Treatment/Interventions Passive range of motion;Manual techniques;Balance training;Therapeutic exercise;Therapeutic activities;Gait training;Taping;Cryotherapy;Patient/family education    PT Next Visit Plan Continue BLE strengthening and progress CKC as tolerated. Hip FL stretch    PT Home Exercise Plan [email or web address]    Consulted and Agree with Plan of Care Patient           Patient will benefit from skilled therapeutic intervention in order to improve the following deficits and impairments:  Pain,Decreased activity tolerance,Decreased strength  Visit Diagnosis: Chronic pain of right knee  Chronic pain of left knee  Stiffness of right hip, not elsewhere classified  Stiffness of left hip, not elsewhere classified  Muscle weakness (generalized)     Problem List Patient Active Problem  List   Diagnosis Date Noted  . Short-term  memory loss 08/03/2020  . Moderate obstructive sleep apnea-hypopnea syndrome 08/03/2020  . History of total right knee replacement 06/07/2020  . History of total left knee replacement 06/07/2020  . Educated about COVID-19 virus infection 04/06/2020  . Dilated aortic root ([place]) 04/06/2020  . Bradycardia 01/06/2020  . Fatigue 01/06/2020  . SOB (shortness of breath) 01/06/2020  . Nonspecific abnormal electrocardiogram (ECG) (EKG) 01/06/2020  . Nonrheumatic mitral valve regurgitation 01/06/2020     Haydee Monica, PT, DPT 08/09/20 11:58 AM  [facility] [address] [place], [place], [address] Phone: [phone]   Fax:  [phone]  Name: Abbot Ballog MRN: [id number] Date of Birth: 15-May-1946

## 2020-08-10 ENCOUNTER — Ambulatory Visit: Payer: Medicare Other | Admitting: Orthopaedic Surgery

## 2020-08-11 ENCOUNTER — Other Ambulatory Visit: Payer: Self-pay

## 2020-08-11 ENCOUNTER — Ambulatory Visit: Payer: Medicare Other

## 2020-08-11 DIAGNOSIS — G8929 Other chronic pain: Secondary | ICD-10-CM | POA: Diagnosis not present

## 2020-08-11 DIAGNOSIS — M25561 Pain in right knee: Secondary | ICD-10-CM | POA: Diagnosis not present

## 2020-08-11 DIAGNOSIS — M25562 Pain in left knee: Secondary | ICD-10-CM | POA: Diagnosis not present

## 2020-08-11 DIAGNOSIS — M6281 Muscle weakness (generalized): Secondary | ICD-10-CM | POA: Diagnosis not present

## 2020-08-11 DIAGNOSIS — M25652 Stiffness of left hip, not elsewhere classified: Secondary | ICD-10-CM

## 2020-08-11 DIAGNOSIS — M25651 Stiffness of right hip, not elsewhere classified: Secondary | ICD-10-CM

## 2020-08-11 NOTE — Therapy (Signed)
[place], [place], [address] Phone: [phone]   Fax:  [phone]  Physical Therapy Treatment  Patient Details  Name: Michael Pace MRN: [id number] Date of Birth: 02-12-1946 Referring Provider (PT): Jean Rosenthal   MD   Encounter Date: 08/11/2020   PT End of Session - 08/11/20 1006    Visit Number 5    Number of Visits 12    Date for PT Re-Evaluation 08/26/20    Authorization Type MCR Lynda Rainwater - FOTO visit 6 and visit 10; PN visit 10; KX modifier 15    PT Start Time 1002    PT Stop Time 1045    PT Time Calculation (min) 43 min    Activity Tolerance Patient tolerated treatment well    Behavior During Therapy [facility] for tasks assessed/performed           Past Medical History:  Diagnosis Date  . BPH (benign prostatic hyperplasia)   . Cataracts, bilateral   . GERD (gastroesophageal reflux disease)   . Melanoma ([place]) [place]  . Squamous cell carcinoma of skin 03/29/2016   RIGHT SIDEBURN TX= CX3 5FU    Past Surgical History:  Procedure Laterality Date  . SKIN CANCER EXCISION     Melanoma  . TOTAL KNEE ARTHROPLASTY     bilateral    There were no vitals filed for this visit.   Subjective Assessment - 08/11/20 1005    Subjective "My knees have been cranky the past couple days after doing my exercises here last time and yesterday at home, but they are feeling okay today. The soreness is noticeable."    Pertinent History Bilateral TKA.    Limitations Walking    How long can you walk comfortably? 30 min fatigued    Diagnostic tests Everything looks good    Patient Stated Goals improve    walking tolerance , improve flexibility  improve strength , decr pain    Currently in Pain? Yes    Pain Score 4     Pain Location Knee    Pain Orientation Right;Left    Pain Descriptors / Indicators Sore    Pain Type Chronic pain    Pain Onset More than a month ago               [facility] PT Assessment - 08/11/20 0001      Assessment   Medical Diagnosis Lt knee pain    Referring Provider (PT) Jean Rosenthal   MD                         [facility] Adult PT Treatment/Exercise - 08/11/20 0001      Knee/Hip Exercises: Stretches   Active Hamstring Stretch Both;1 rep;60 seconds    Active Hamstring Stretch Limitations seated at EOM    Hip Flexor Stretch Both;1 rep;60 seconds      Knee/Hip Exercises: Aerobic   Nustep L6 x 6 min LE only      Knee/Hip Exercises: Standing   Heel Raises Both;2 sets;15 reps    Heel Raises Limitations 1 set on level ground; 1 set on airex    Hip Flexion Stengthening;Both;15 reps;Knee bent    Hip Flexion Limitations alternating marches on airex x 15 each LE    Hip Abduction Stengthening;Both;2 sets;15 reps;Knee straight    Abduction Limitations on airex    Hip Extension Stengthening;Both;2 sets;15 reps;Knee straight    Extension Limitations on airex  Forward Step Up Both;20 reps;Step Height: 6"    Forward Step Up Limitations 10x leading with RLE and 10x leading with LLE      Knee/Hip Exercises: Seated   Long Arc Quad Strengthening;Both;2 sets;10 reps    Long Arc Quad Weight 2 lbs.    Long Arc Quad Limitations 3 sec hold at end range knee EXT                  PT Education - 08/11/20 1112    Education Details Updated HEP to include hamstring and Thomas stretches.    Person(s) Educated Patient    Methods Explanation;Demonstration;Verbal cues    Comprehension Verbalized understanding;Returned demonstration            PT Short Term Goals - 08/09/20 1154      PT SHORT TERM GOAL #1   Title He will have decreased pain with walking 30 min    Status On-going      PT SHORT TERM GOAL #2   Title He will be indpendent with initial HEP issued    Status Achieved      PT SHORT TERM GOAL #3   Title He will improve FOTO by 5 points    Baseline Will reassess at visit 6    Status On-going              PT Long Term Goals - 07/14/20 1612      PT LONG TERM GOAL #1   Title He will be indpendent with all hEP issued    Time 6    Period Weeks    Status New      PT LONG TERM GOAL #2   Title He will improve FOTO score to 68 %    Time 6    Period Weeks    Status New      PT LONG TERM GOAL #3   Title He will be able to walk for 45 min with onlu mild knee pain and fatigue.    Time 6    Period Weeks    Status New      PT LONG TERM GOAL #4   Title Hip strength will improve to 4+/5 to improve walking tolerance.    Time 6    Period Weeks    Status New      PT LONG TERM GOAL #5   Title Hewill report geneally pain 50% improved with activity on feet around home tasks    Time 6    Period Weeks    Status New                 Plan - 08/11/20 1007    Clinical Impression Statement Patient tolerated therapy session well with no adverse effects or complaints of increased pain. Pt was able to perform standing activities on airex for dynamic balance and strengthening. Pt had increased DOE and took rest breaks to allow for breathing exercises between sets, but his O2 remained >/= 97% throughout session when assessed via pulse oximeter.    Personal Factors and Comorbidities Age;Time since onset of injury/illness/exacerbation    Examination-Activity Limitations Squat;Stairs;Locomotion Level    Examination-Participation Restrictions Community Activity    PT Treatment/Interventions Passive range of motion;Manual techniques;Balance training;Therapeutic exercise;Therapeutic activities;Gait training;Taping;Cryotherapy;Patient/family education    PT Next Visit Plan FOTO (6th visit). Review and update HEP PRN. Progress BLE strengthening and balance as tolerated. Cues for deep breathing and improved posture to allow for better breathing throughout session.    PT  Home Exercise Plan [email or web address]    Consulted and Agree with Plan of Care Patient           Patient will benefit from skilled  therapeutic intervention in order to improve the following deficits and impairments:  Pain,Decreased activity tolerance,Decreased strength  Visit Diagnosis: Chronic pain of right knee  Chronic pain of left knee  Stiffness of right hip, not elsewhere classified  Stiffness of left hip, not elsewhere classified  Muscle weakness (generalized)     Problem List Patient Active Problem List   Diagnosis Date Noted  . Short-term memory loss 08/03/2020  . Moderate obstructive sleep apnea-hypopnea syndrome 08/03/2020  . History of total right knee replacement 06/07/2020  . History of total left knee replacement 06/07/2020  . Educated about COVID-19 virus infection 04/06/2020  . Dilated aortic root ([place]) 04/06/2020  . Bradycardia 01/06/2020  . Fatigue 01/06/2020  . SOB (shortness of breath) 01/06/2020  . Nonspecific abnormal electrocardiogram (ECG) (EKG) 01/06/2020  . Nonrheumatic mitral valve regurgitation 01/06/2020     Haydee Monica, PT, DPT 08/11/20 11:17 AM  [facility] [address] [place], [place], [address] Phone: [phone]   Fax:  [phone]  Name: Michael Pace MRN: [id number] Date of Birth: 10-Apr-1946

## 2020-08-16 ENCOUNTER — Other Ambulatory Visit: Payer: Self-pay

## 2020-08-16 ENCOUNTER — Ambulatory Visit: Payer: Medicare Other | Admitting: Physical Therapy

## 2020-08-16 DIAGNOSIS — M25651 Stiffness of right hip, not elsewhere classified: Secondary | ICD-10-CM

## 2020-08-16 DIAGNOSIS — G8929 Other chronic pain: Secondary | ICD-10-CM | POA: Diagnosis not present

## 2020-08-16 DIAGNOSIS — M6281 Muscle weakness (generalized): Secondary | ICD-10-CM | POA: Diagnosis not present

## 2020-08-16 DIAGNOSIS — M25561 Pain in right knee: Secondary | ICD-10-CM | POA: Diagnosis not present

## 2020-08-16 DIAGNOSIS — M25652 Stiffness of left hip, not elsewhere classified: Secondary | ICD-10-CM | POA: Diagnosis not present

## 2020-08-16 DIAGNOSIS — M25562 Pain in left knee: Secondary | ICD-10-CM | POA: Diagnosis not present

## 2020-08-16 NOTE — Therapy (Signed)
[place], [place], [address] Phone: [phone]   Fax:  [phone]  Physical Therapy Treatment  Patient Details  Name: Michael Pace MRN: [id number] Date of Birth: [birth year] Referring Provider (PT): Jean Rosenthal   MD   Encounter Date: 08/16/2020   PT End of Session - 08/16/20 1002    Visit Number 6    Number of Visits 12    Date for PT Re-Evaluation 08/26/20    Authorization Type MCR Lynda Rainwater - FOTO visit 6 and visit 10; PN visit 10; KX modifier 15    PT Start Time 1000    PT Stop Time 1045    PT Time Calculation (min) 45 min    Activity Tolerance Patient tolerated treatment well    Behavior During Therapy [facility] for tasks assessed/performed           Past Medical History:  Diagnosis Date  . BPH (benign prostatic hyperplasia)   . Cataracts, bilateral   . GERD (gastroesophageal reflux disease)   . Melanoma ([place]) [place]  . Squamous cell carcinoma of skin 03/29/2016   RIGHT SIDEBURN TX= CX3 5FU    Past Surgical History:  Procedure Laterality Date  . SKIN CANCER EXCISION     Melanoma  . TOTAL KNEE ARTHROPLASTY     bilateral    There were no vitals filed for this visit.   Subjective Assessment - 08/16/20 1005    Subjective Has some soreness in knees after exercises.  Back pain with exercises.  Walks about 30 min with wife and can do this so thats a good sign.    Currently in Pain? Yes              [place] Adult PT Treatment/Exercise - 08/16/20 0001      Self-Care   Other Self-Care Comments  gym ex, HEP and LE alignment      Knee/Hip Exercises: Stretches   Other Knee/Hip Stretches lower trunk rotation 10 sec c x10      Knee/Hip Exercises: Aerobic   Nustep L5 UE andd      Knee/Hip Exercises: Machines for Strengthening   Cybex Knee Extension 3 sets x 12-15 reps gradu    Cybex Leg Press 3 x 15 , 60 lbs, wide and parallel stance      Knee/Hip  Exercises: Standing   Heel Raises Limitations used leg press 2 plates x 15 reps cues needed    Hip Abduction Stengthening;Both;1 set;Knee straight;15 reps      Knee/Hip Exercises: Supine   Bridges 2 sets;Strengthening;10 reps    Bridges Limitations blue band                  PT Education - 08/16/20 1017    Education Details gym machine exercises    Person(s) Educated Patient    Methods Explanation;Verbal cues    Comprehension Verbalized understanding;Returned demonstration            PT Short Term Goals - 08/16/20 1011      PT SHORT TERM GOAL #1   Title He will have decreased pain with walking 30 min    Baseline has mild increase in knee pain but more related to fatigue than knee pain    Status Partially Met      PT SHORT TERM GOAL #2   Title He will be indpendent with initial HEP issued    Status On-going      PT SHORT TERM  GOAL #3   Title He will improve FOTO by 5 points    Baseline 59%    Status Achieved             PT Long Term Goals - 07/14/20 1612      PT LONG TERM GOAL #1   Title He will be indpendent with all hEP issued    Time 6    Period Weeks    Status New      PT LONG TERM GOAL #2   Title He will improve FOTO score to 68 %    Time 6    Period Weeks    Status New      PT LONG TERM GOAL #3   Title He will be able to walk for 45 min with onlu mild knee pain and fatigue.    Time 6    Period Weeks    Status New      PT LONG TERM GOAL #4   Title Hip strength will improve to 4+/5 to improve walking tolerance.    Time 6    Period Weeks    Status New      PT LONG TERM GOAL #5   Title Hewill report geneally pain 50% improved with activity on feet around home tasks    Time 6    Period Weeks    Status New                 Plan - 08/16/20 1010    Clinical Impression Statement Pt did well today, began working towards transition to [employer] as that is is plan post DC.  Weight machines effective for progresive strengthening and  increasing HR.  FOTO score improved.  Pleased with progress, made corrections for HEP technique and streamlined for ease.    PT Treatment/Interventions Passive range of motion;Manual techniques;Balance training;Therapeutic exercise;Therapeutic activities;Gait training;Taping;Cryotherapy;Patient/family education    PT Next Visit Plan hip, core , knee strength.  Endurance- walking on TM vs laps , monitor breathig, o2 sat    PT Home Exercise Plan [email or web address]    Consulted and Agree with Plan of Care Patient           Patient will benefit from skilled therapeutic intervention in order to improve the following deficits and impairments:  Pain,Decreased activity tolerance,Decreased strength  Visit Diagnosis: Chronic pain of right knee  Chronic pain of left knee  Stiffness of right hip, not elsewhere classified  Stiffness of left hip, not elsewhere classified  Muscle weakness (generalized)     Problem List Patient Active Problem List   Diagnosis Date Noted  . Short-term memory loss 08/03/2020  . Moderate obstructive sleep apnea-hypopnea syndrome 08/03/2020  . History of total right knee replacement 06/07/2020  . History of total left knee replacement 06/07/2020  . Educated about COVID-19 virus infection 04/06/2020  . Dilated aortic root ([place]) 04/06/2020  . Bradycardia 01/06/2020  . Fatigue 01/06/2020  . SOB (shortness of breath) 01/06/2020  . Nonspecific abnormal electrocardiogram (ECG) (EKG) 01/06/2020  . Nonrheumatic mitral valve regurgitation 01/06/2020    PAA,JENNIFER 08/16/2020, 12:00 PM  [facility] [address] [place], [place], [address] Phone: [phone]   Fax:  [phone]  Name: Dacian Orrico MRN: [id number] Date of Birth: 15-Oct-1945

## 2020-08-18 ENCOUNTER — Ambulatory Visit: Payer: Medicare Other | Admitting: Physical Therapy

## 2020-08-18 ENCOUNTER — Other Ambulatory Visit: Payer: Self-pay

## 2020-08-18 ENCOUNTER — Encounter: Payer: Self-pay | Admitting: Physical Therapy

## 2020-08-18 DIAGNOSIS — M6281 Muscle weakness (generalized): Secondary | ICD-10-CM

## 2020-08-18 DIAGNOSIS — M25561 Pain in right knee: Secondary | ICD-10-CM

## 2020-08-18 DIAGNOSIS — G8929 Other chronic pain: Secondary | ICD-10-CM

## 2020-08-18 DIAGNOSIS — M25651 Stiffness of right hip, not elsewhere classified: Secondary | ICD-10-CM

## 2020-08-18 DIAGNOSIS — M25652 Stiffness of left hip, not elsewhere classified: Secondary | ICD-10-CM | POA: Diagnosis not present

## 2020-08-18 DIAGNOSIS — M25562 Pain in left knee: Secondary | ICD-10-CM | POA: Diagnosis not present

## 2020-08-18 NOTE — Therapy (Signed)
[facility] [address] [place], [place], [address] Phone: [phone]   Fax:  [phone]  Physical Therapy Treatment  Patient Details  Name: Michael Pace MRN: [id number] Date of Birth: 05/27/1946 Referring Provider (PT): Christopher Blackman   MD   Encounter Date: 08/18/2020   PT End of Session - 08/18/20 0936    Visit Number 7    Number of Visits 12    Date for PT Re-Evaluation 08/26/20    Authorization Type MCR /AARP - FOTO visit 6 and visit 10; PN visit 10; KX modifier 15    PT Start Time 0916    PT Stop Time 1000    PT Time Calculation (min) 44 min    Activity Tolerance Patient tolerated treatment well    Behavior During Therapy WFL for tasks assessed/performed           Past Medical History:  Diagnosis Date  . BPH (benign prostatic hyperplasia)   . Cataracts, bilateral   . GERD (gastroesophageal reflux disease)   . Melanoma (HCC) 1988   LEFT ABDOMEN [place]- [place]  . Squamous cell carcinoma of skin 03/29/2016   RIGHT SIDEBURN [place]= CX3 5FU    Past Surgical History:  Procedure Laterality Date  . SKIN CANCER EXCISION     Melanoma  . TOTAL KNEE ARTHROPLASTY     bilateral    There were no vitals filed for this visit.   Subjective Assessment - 08/18/20 0919    Subjective The knees are cranky today, 4/10.    Currently in Pain? Yes    Pain Score 4     Pain Location Knee    Pain Orientation Right;Left    Pain Descriptors / Indicators Sore    Pain Type Chronic pain    Pain Onset More than a month ago    Aggravating Factors  walking, cold, AM    Pain Relieving Factors rest    Multiple Pain Sites No                [facility] Adult PT Treatment/Exercise - 08/18/20 0001      Knee/Hip Exercises: Stretches   Active Hamstring Stretch 2 reps;Both;30 seconds    Active Hamstring Stretch Limitations strap    Piriformis Stretch 2 reps    Piriformis Stretch Limitations figure 4 push and pull      Knee/Hip  Exercises: Aerobic   Nustep L6 LEs only 5 min      Knee/Hip Exercises: Supine   Heel Slides Limitations hamstring curls x 15    Bridges Limitations with ball 2 sets x 10    Straight Leg Raises Strengthening;Both;2 sets;15 reps    Other Supine Knee/Hip Exercises supine lower trunk with physio ball x 10      Knee/Hip Exercises: Sidelying   Hip ABduction Strengthening;Both;1 set;15 reps    Clams 2 x 15 with green band                    PT Short Term Goals - 08/16/20 1011      PT SHORT TERM GOAL #1   Title He will have decreased pain with walking 30 min    Baseline has mild increase in knee pain but more related to fatigue than knee pain    Status Partially Met      PT SHORT TERM GOAL #2   Title He will be indpendent with initial HEP issued    Status On-going      PT SHORT TERM GOAL #  3   Title He will improve FOTO by 5 points    Baseline 59%    Status Achieved             PT Long Term Goals - 08/18/20 0936      PT LONG TERM GOAL #1   Title He will be indpendent with all hEP issued    Status On-going      PT LONG TERM GOAL #2   Title He will improve FOTO score to 68 %    Status On-going      PT LONG TERM GOAL #3   Title He will be able to walk for 45 min with onlu mild knee pain and fatigue.    Status On-going      PT LONG TERM GOAL #4   Title Hip strength will improve to 4+/5 to improve walking tolerance.    Status On-going      PT LONG TERM GOAL #5   Title Hewill report geneally pain 50% improved with activity on feet around home tasks    Status On-going                 Plan - 08/18/20 0937    Clinical Impression Statement Pt without adverse effects of last session's weight training.  Able to perform mat level exercises and step ups without pain .  Targeted hips and core for knee support. Increased band tension to green for hip exercises.    PT Treatment/Interventions Passive range of motion;Manual techniques;Balance training;Therapeutic  exercise;Therapeutic activities;Gait training;Taping;Cryotherapy;Patient/family education    PT Next Visit Plan hip, core , knee strength.  Endurance- walking on TM vs laps vs elliptical  , monitor breathig, o2 sat    PT Home Exercise Plan [email or web address]    Consulted and Agree with Plan of Care Patient           Patient will benefit from skilled therapeutic intervention in order to improve the following deficits and impairments:  Pain,Decreased activity tolerance,Decreased strength  Visit Diagnosis: Chronic pain of right knee  Chronic pain of left knee  Stiffness of right hip, not elsewhere classified  Stiffness of left hip, not elsewhere classified  Muscle weakness (generalized)     Problem List Patient Active Problem List   Diagnosis Date Noted  . Short-term memory loss 08/03/2020  . Moderate obstructive sleep apnea-hypopnea syndrome 08/03/2020  . History of total right knee replacement 06/07/2020  . History of total left knee replacement 06/07/2020  . Educated about COVID-19 virus infection 04/06/2020  . Dilated aortic root ([place]) 04/06/2020  . Bradycardia 01/06/2020  . Fatigue 01/06/2020  . SOB (shortness of breath) 01/06/2020  . Nonspecific abnormal electrocardiogram (ECG) (EKG) 01/06/2020  . Nonrheumatic mitral valve regurgitation 01/06/2020    Harmonee Tozer 08/18/2020, 9:56 AM  [facility] [address] [place], [place], [address] Phone: [phone]   Fax:  [phone]  Name: Michael Pace MRN: [id number] Date of Birth: 1945-12-26  Raeford Razor, PT 08/18/20 9:58 AM Phone: [phone] Fax: [phone]

## 2020-08-23 ENCOUNTER — Other Ambulatory Visit: Payer: Self-pay

## 2020-08-23 ENCOUNTER — Encounter: Payer: Self-pay | Admitting: Physical Therapy

## 2020-08-23 ENCOUNTER — Ambulatory Visit: Payer: Medicare Other | Attending: Orthopaedic Surgery | Admitting: Physical Therapy

## 2020-08-23 DIAGNOSIS — G8929 Other chronic pain: Secondary | ICD-10-CM | POA: Insufficient documentation

## 2020-08-23 DIAGNOSIS — M25561 Pain in right knee: Secondary | ICD-10-CM | POA: Diagnosis not present

## 2020-08-23 DIAGNOSIS — M25651 Stiffness of right hip, not elsewhere classified: Secondary | ICD-10-CM | POA: Insufficient documentation

## 2020-08-23 DIAGNOSIS — M25562 Pain in left knee: Secondary | ICD-10-CM | POA: Diagnosis not present

## 2020-08-23 DIAGNOSIS — M6281 Muscle weakness (generalized): Secondary | ICD-10-CM | POA: Insufficient documentation

## 2020-08-23 DIAGNOSIS — M25652 Stiffness of left hip, not elsewhere classified: Secondary | ICD-10-CM | POA: Insufficient documentation

## 2020-08-23 NOTE — Therapy (Signed)
[place], [place], [address] Phone: [phone]   Fax:  [phone]  Physical Therapy Treatment  Patient Details  Name: Michael Pace MRN: [id number] Date of Birth: 1946-07-15 Referring Provider (PT): Jean Rosenthal   MD   Encounter Date: 08/23/2020   PT End of Session - 08/23/20 0930    Visit Number 8    Number of Visits 12    Date for PT Re-Evaluation 08/26/20    Authorization Type MCR Lynda Rainwater - FOTO visit 6 and visit 10; PN visit 10; KX modifier 15    PT Start Time 0920    Activity Tolerance Patient tolerated treatment well    Behavior During Therapy [facility] for tasks assessed/performed           Past Medical History:  Diagnosis Date  . BPH (benign prostatic hyperplasia)   . Cataracts, bilateral   . GERD (gastroesophageal reflux disease)   . Melanoma ([place]) [place]  . Squamous cell carcinoma of skin 03/29/2016   RIGHT SIDEBURN [place]= CX3 5FU    Past Surgical History:  Procedure Laterality Date  . SKIN CANCER EXCISION     Melanoma  . TOTAL KNEE ARTHROPLASTY     bilateral    There were no vitals filed for this visit.   Subjective Assessment - 08/23/20 0923    Subjective Not really hurting today. I want to spend today getting ready for the gym.    Currently in Pain? No/denies                [facility] Adult PT Treatment/Exercise - 08/23/20 0001      Knee/Hip Exercises: Aerobic   Nustep L6 LEs only 5 min      Knee/Hip Exercises: Machines for Strengthening   Cybex Knee Extension 35 lbs 3 x 10    Cybex Knee Flexion 45 lbs 3 x 10    Cybex Leg Press 80 lbs , 3 x 15    Other Machine calf raise 80 lbs 2 x 15      Shoulder Exercises: ROM/Strengthening   Lat Pull Limitations 25 lbs 2 x 15 reps    Cybex Press Limitations 2 x 15 reps , 20 lbs    Cybex Row Limitations 2 x 15, 35 lbs                    PT Short Term Goals - 08/16/20 1011      PT  SHORT TERM GOAL #1   Title He will have decreased pain with walking 30 min    Baseline has mild increase in knee pain but more related to fatigue than knee pain    Status Partially Met      PT SHORT TERM GOAL #2   Title He will be indpendent with initial HEP issued    Status On-going      PT SHORT TERM GOAL #3   Title He will improve FOTO by 5 points    Baseline 59%    Status Achieved             PT Long Term Goals - 08/18/20 0936      PT LONG TERM GOAL #1   Title He will be indpendent with all hEP issued    Status On-going      PT LONG TERM GOAL #2   Title He will improve FOTO score to 68 %    Status On-going  PT LONG TERM GOAL #3   Title He will be able to walk for 45 min with onlu mild knee pain and fatigue.    Status On-going      PT LONG TERM GOAL #4   Title Hip strength will improve to 4+/5 to improve walking tolerance.    Status On-going      PT LONG TERM GOAL #5   Title Hewill report geneally pain 50% improved with activity on feet around home tasks    Status On-going                 Plan - 08/23/20 1403    Clinical Impression Statement Patient without knee pain today. Did well with gym exercses and review.  Noted weights lifted and included exercise prescription for reps, sets, etc.  1 more visit then DC.    PT Treatment/Interventions Passive range of motion;Manual techniques;Balance training;Therapeutic exercise;Therapeutic activities;Gait training;Taping;Cryotherapy;Patient/family education    PT Next Visit Plan safe core exercses, FOTO and DC , elliptical!    PT Home Exercise Plan [email or web address]    Consulted and Agree with Plan of Care Patient           Patient will benefit from skilled therapeutic intervention in order to improve the following deficits and impairments:  Pain,Decreased activity tolerance,Decreased strength  Visit Diagnosis: Chronic pain of right knee  Chronic pain of left knee  Stiffness of right hip, not elsewhere  classified  Stiffness of left hip, not elsewhere classified  Muscle weakness (generalized)     Problem List Patient Active Problem List   Diagnosis Date Noted  . Short-term memory loss 08/03/2020  . Moderate obstructive sleep apnea-hypopnea syndrome 08/03/2020  . History of total right knee replacement 06/07/2020  . History of total left knee replacement 06/07/2020  . Educated about COVID-19 virus infection 04/06/2020  . Dilated aortic root ([place]) 04/06/2020  . Bradycardia 01/06/2020  . Fatigue 01/06/2020  . SOB (shortness of breath) 01/06/2020  . Nonspecific abnormal electrocardiogram (ECG) (EKG) 01/06/2020  . Nonrheumatic mitral valve regurgitation 01/06/2020    PAA,JENNIFER 08/23/2020, 2:09 PM  [facility] [address] [place], [place], [address] Phone: [phone]   Fax:  [phone]  Name: Michael Pace MRN: [id number] Date of Birth: 17-May-1946  Raeford Razor, PT 08/23/20 2:09 PM Phone: [phone] Fax: [phone]

## 2020-08-25 ENCOUNTER — Ambulatory Visit: Payer: Medicare Other | Admitting: Physical Therapy

## 2020-08-25 ENCOUNTER — Encounter: Payer: Self-pay | Admitting: Physical Therapy

## 2020-08-25 ENCOUNTER — Other Ambulatory Visit: Payer: Self-pay

## 2020-08-25 DIAGNOSIS — M25562 Pain in left knee: Secondary | ICD-10-CM | POA: Diagnosis not present

## 2020-08-25 DIAGNOSIS — G8929 Other chronic pain: Secondary | ICD-10-CM | POA: Diagnosis not present

## 2020-08-25 DIAGNOSIS — M25652 Stiffness of left hip, not elsewhere classified: Secondary | ICD-10-CM | POA: Diagnosis not present

## 2020-08-25 DIAGNOSIS — M25561 Pain in right knee: Secondary | ICD-10-CM

## 2020-08-25 DIAGNOSIS — M6281 Muscle weakness (generalized): Secondary | ICD-10-CM | POA: Diagnosis not present

## 2020-08-25 DIAGNOSIS — M25651 Stiffness of right hip, not elsewhere classified: Secondary | ICD-10-CM

## 2020-08-25 NOTE — Therapy (Signed)
[facility] [address] [place], [place], [address] Phone: [phone]   Fax:  [phone]  Physical Therapy Treatment/Discharge  Patient Details  Name: Michael Pace MRN: [id number] Date of Birth: [birth year] Referring Provider (PT): Doneen Poisson   MD   Encounter Date: 08/25/2020   PT End of Session - 08/25/20 0927    Visit Number 9    Number of Visits 12    Date for PT Re-Evaluation 08/26/20    Authorization Type MCR Susa Simmonds - FOTO visit 6 and visit 10; PN visit 10; KX modifier 15    PT Start Time 0920    PT Stop Time 1000    PT Time Calculation (min) 40 min    Activity Tolerance Patient tolerated treatment well    Behavior During Therapy [facility] for tasks assessed/performed           Past Medical History:  Diagnosis Date  . BPH (benign prostatic hyperplasia)   . Cataracts, bilateral   . GERD (gastroesophageal reflux disease)   . Melanoma (HCC) 1988   LEFT ABDOMEN [place]- [place]  . Squamous cell carcinoma of skin 03/29/2016   RIGHT SIDEBURN [place]= CX3 5FU    Past Surgical History:  Procedure Laterality Date  . SKIN CANCER EXCISION     Melanoma  . TOTAL KNEE ARTHROPLASTY     bilateral    There were no vitals filed for this visit.   Subjective Assessment - 08/25/20 0927    Subjective No pain .  Ready for DC.  I was very tired.    Currently in Pain? No/denies              [facility] PT Assessment - 08/25/20 0001      AROM   Right Knee Extension 0    Right Knee Flexion 132    Left Knee Extension -5    Left Knee Flexion 132      Strength   Right Hip Flexion 4+/5    Right Hip ABduction 3+/5    Left Hip Flexion 4+/5    Right Knee Flexion 5/5    Right Knee Extension 5/5    Left Knee Flexion 5/5    Left Knee Extension 5/5                         [facility] Adult PT Treatment/Exercise - 08/25/20 0001      Self-Care   Other Self-Care Comments  progress, RPE scale vs HR , goals, core       Knee/Hip Exercises: Aerobic   Elliptical 5 min L 8 ramp, L 1 resistance      Knee/Hip Exercises: Supine   Bridges Limitations x 20    Straight Leg Raises Both;1 set    Straight Leg Raises Limitations x 20      Knee/Hip Exercises: Sidelying   Hip ABduction Limitations 2 x 10                  PT Education - 08/25/20 0953    Education Details HEP, RPE scale    Person(s) Educated Patient    Methods Explanation;Demonstration    Comprehension Verbalized understanding;Returned demonstration            PT Short Term Goals - 08/25/20 0939      PT SHORT TERM GOAL #1   Title He will have decreased pain with walking 30 min    Baseline has mild increase in knee pain but more related to  fatigue than knee pain    Status Partially Met      PT SHORT TERM GOAL #2   Title He will be indpendent with initial HEP issued    Status Achieved      PT SHORT TERM GOAL #3   Title He will improve FOTO by 5 points    Baseline 59% from 47z7 on visit 6    Status Achieved             PT Long Term Goals - 08/25/20 0941      PT LONG TERM GOAL #1   Title He will be indpendent with all hEP issued    Status Achieved      PT LONG TERM GOAL #2   Title He will improve FOTO score to 68 %    Baseline 59%    Status Not Met      PT LONG TERM GOAL #3   Title He will be able to walk for 45 min with onlu mild knee pain and fatigue.    Status Partially Met      PT LONG TERM GOAL #4   Title Hip strength will improve to 4+/5 to improve walking tolerance.    Baseline L hip abd 4+/5 and Rt 3+/5    Status Partially Met      PT LONG TERM GOAL #5   Title Hewill report geneally pain 50% improved with activity on feet around home tasks    Status Achieved                 Plan - 08/25/20 0946    Clinical Impression Statement Reviewed HEP.  Strength still lacking in hips but improved since starting PT. Limited by fatigue with walking vs knee pain .  PLans to attend [employer] 3 x per week to  maintain progress.    PT Treatment/Interventions Passive range of motion;Manual techniques;Balance training;Therapeutic exercise;Therapeutic activities;Gait training;Taping;Cryotherapy;Patient/family education    PT Next Visit Plan NA, DC    PT Home Exercise Plan [email or web address]    Consulted and Agree with Plan of Care Patient           Patient will benefit from skilled therapeutic intervention in order to improve the following deficits and impairments:  Pain,Decreased activity tolerance,Decreased strength  Visit Diagnosis: Chronic pain of right knee  Chronic pain of left knee  Stiffness of right hip, not elsewhere classified  Muscle weakness (generalized)     Problem List Patient Active Problem List   Diagnosis Date Noted  . Short-term memory loss 08/03/2020  . Moderate obstructive sleep apnea-hypopnea syndrome 08/03/2020  . History of total right knee replacement 06/07/2020  . History of total left knee replacement 06/07/2020  . Educated about COVID-19 virus infection 04/06/2020  . Dilated aortic root ([place]) 04/06/2020  . Bradycardia 01/06/2020  . Fatigue 01/06/2020  . SOB (shortness of breath) 01/06/2020  . Nonspecific abnormal electrocardiogram (ECG) (EKG) 01/06/2020  . Nonrheumatic mitral valve regurgitation 01/06/2020    Michael Pace 08/25/2020, 3:39 PM  [facility] [address] [place], [place], [address] Phone: [phone]   Fax:  [phone]  Name: Michael Pace MRN: [id number] Date of Birth: 1945/09/16  Raeford Razor, PT 08/25/20 3:39 PM Phone: [phone] Fax: [phone]  PHYSICAL THERAPY DISCHARGE SUMMARY  Visits from Start of Care: 9  Current functional level related to goals / functional outcomes: See above   Remaining deficits: Hip weakness, endurance    Education / Equipment: HEP, gym exercises, self care  Plan:  Patient agrees to discharge.  Patient goals were partially met. Patient is  being discharged due to being pleased with the current functional level.  ?????    Raeford Razor, PT 08/25/20 3:40 PM Phone: [phone] Fax: [phone]

## 2020-08-25 NOTE — Patient Instructions (Signed)
Abduction: Side Leg Lift (Eccentric) - Side-Lying    Lie on side. Lift top leg slightly higher than shoulder level. Keep top leg straight with body, toes pointing forward. Slowly lower for 3-5 seconds. ___ reps per set, ___ sets per day, ___ days per week. Add ___ lbs when you achieve ___ repetitions.  http://ecce.exer.us/62   Copyright  VHI. All rights reserved.

## 2020-08-31 ENCOUNTER — Ambulatory Visit (INDEPENDENT_AMBULATORY_CARE_PROVIDER_SITE_OTHER): Payer: Medicare Other | Admitting: Orthopaedic Surgery

## 2020-08-31 ENCOUNTER — Encounter: Payer: Self-pay | Admitting: Orthopaedic Surgery

## 2020-08-31 DIAGNOSIS — Z96651 Presence of right artificial knee joint: Secondary | ICD-10-CM

## 2020-08-31 DIAGNOSIS — M25562 Pain in left knee: Secondary | ICD-10-CM | POA: Diagnosis not present

## 2020-08-31 DIAGNOSIS — M25561 Pain in right knee: Secondary | ICD-10-CM | POA: Diagnosis not present

## 2020-08-31 DIAGNOSIS — Z96652 Presence of left artificial knee joint: Secondary | ICD-10-CM

## 2020-08-31 DIAGNOSIS — G8929 Other chronic pain: Secondary | ICD-10-CM | POA: Diagnosis not present

## 2020-08-31 NOTE — Progress Notes (Signed)
HPI: Michael Pace returns today for follow-up of his bilateral knees.  [place] early [place]'s.  He has been going to physical therapy and feels that this is helped some.  He still has some pain in his knees but not as bad as it was.  He is having no mechanical symptoms outside of the left knee is difficult to straighten at times when he first gets up.  He notes that when he walks for exercise that he does have some discomfort in both knees.  Bilateral knees: Good range of motion of both knees.  No instability valgus varus stressing.  Surgical incisions well-healed.  No effusion abnormal warmth erythema or ecchymosis bilateral knees.  No instability with anterior posterior drawer bilateral knees.  No significant crepitus with passive range of motion of both knees.  Impression: Status post bilateral total knee arthroplasties with chronic pain  Plan: Patient does not feel that the pain in both knees is severe enough to warrant surgery.  He will continue to work on quad strengthening of both knees.  Follow-up with [place] as needed.  Questions were encouraged and answered at length today.

## 2020-11-10 DIAGNOSIS — Z23 Encounter for immunization: Secondary | ICD-10-CM | POA: Diagnosis not present

## 2020-12-21 ENCOUNTER — Other Ambulatory Visit: Payer: Self-pay

## 2020-12-21 ENCOUNTER — Ambulatory Visit (HOSPITAL_COMMUNITY): Payer: Medicare Other | Attending: Internal Medicine

## 2020-12-21 DIAGNOSIS — I340 Nonrheumatic mitral (valve) insufficiency: Secondary | ICD-10-CM | POA: Insufficient documentation

## 2020-12-21 LAB — ECHOCARDIOGRAM COMPLETE
Area-P 1/2: 3.27 cm2
P 1/2 time: 584 ms
S' Lateral: 3.5 cm

## 2021-02-20 ENCOUNTER — Ambulatory Visit: Payer: Medicare Other | Admitting: Dermatology

## 2021-03-21 DIAGNOSIS — Z23 Encounter for immunization: Secondary | ICD-10-CM | POA: Diagnosis not present

## 2021-05-10 ENCOUNTER — Other Ambulatory Visit: Payer: Self-pay

## 2021-05-10 ENCOUNTER — Ambulatory Visit (INDEPENDENT_AMBULATORY_CARE_PROVIDER_SITE_OTHER): Payer: Medicare Other | Admitting: Dermatology

## 2021-05-10 ENCOUNTER — Encounter: Payer: Self-pay | Admitting: Dermatology

## 2021-05-10 DIAGNOSIS — Z85828 Personal history of other malignant neoplasm of skin: Secondary | ICD-10-CM

## 2021-05-10 DIAGNOSIS — Z85820 Personal history of malignant melanoma of skin: Secondary | ICD-10-CM

## 2021-05-10 DIAGNOSIS — Z1283 Encounter for screening for malignant neoplasm of skin: Secondary | ICD-10-CM

## 2021-05-10 DIAGNOSIS — D2372 Other benign neoplasm of skin of left lower limb, including hip: Secondary | ICD-10-CM

## 2021-05-10 DIAGNOSIS — D692 Other nonthrombocytopenic purpura: Secondary | ICD-10-CM | POA: Diagnosis not present

## 2021-05-10 DIAGNOSIS — C4491 Basal cell carcinoma of skin, unspecified: Secondary | ICD-10-CM

## 2021-05-10 DIAGNOSIS — D485 Neoplasm of uncertain behavior of skin: Secondary | ICD-10-CM

## 2021-05-10 DIAGNOSIS — L570 Actinic keratosis: Secondary | ICD-10-CM

## 2021-05-10 DIAGNOSIS — C44619 Basal cell carcinoma of skin of left upper limb, including shoulder: Secondary | ICD-10-CM | POA: Diagnosis not present

## 2021-05-10 DIAGNOSIS — D239 Other benign neoplasm of skin, unspecified: Secondary | ICD-10-CM

## 2021-05-10 HISTORY — DX: Basal cell carcinoma of skin, unspecified: C44.91

## 2021-05-10 NOTE — Patient Instructions (Signed)

## 2021-05-15 ENCOUNTER — Telehealth: Payer: Self-pay | Admitting: *Deleted

## 2021-05-15 NOTE — Telephone Encounter (Signed)
Path to patients wife. Made surgery appointment with Dr.Tafeen.

## 2021-05-24 ENCOUNTER — Encounter: Payer: Self-pay | Admitting: Dermatology

## 2021-05-24 NOTE — Progress Notes (Signed)
   Follow-Up Visit   Subjective  Michael Pace is a 75 y.o. male who presents for the following: Annual Exam (Both ears tender , left arm and left leg new lesions to check. History of mm and scc ).  General skin examination, check newer spots on ears, face, scalp, arm Location:  Duration:  Quality:  Associated Signs/Symptoms: Modifying Factors:  Severity:  Timing: Context:   Objective  Well appearing patient in no apparent distress; mood and affect are within normal limits. General skin examination: No sign of new or recurrent melanoma.  1 possible nonmelanoma skin cancer left arm will be biopsied.  Left Ear, Mid Tip of Nose, Right Ear, Right Forehead (2), Torso - Posterior (Back) Tip of nose biopsy proven ak plus half dozen other gritty and hornlike pink crusts, some a bit tender  Left Forearm - Posterior full body skin check left arm bruise, many angiomas on the chest.  Denies other areas of abnormal bleeding.  Left Forearm - Posterior 61mm waxy pink plaque papule with focal erosion, probable carcinoma     Left Lower Leg - Posterior Left lower leg new but no change     All skin waist up examined.   Assessment & Plan    AK (actinic keratosis) (6) Left Ear; Right Ear; Torso - Posterior (Back); Mid Tip of Nose; Right Forehead (2)  Destruction of lesion - Left Ear, Mid Tip of Nose, Right Ear, Right Forehead, Torso - Posterior (Back) Complexity: simple   Destruction method: cryotherapy   Informed consent: discussed and consent obtained   Timeout:  patient name, date of birth, surgical site, and procedure verified Lesion destroyed using liquid nitrogen: Yes   Cryotherapy cycles:  3 Outcome: patient tolerated procedure well with no complications   Post-procedure details: wound care instructions given    Solar purpura ([place]) Left Forearm - Posterior  Over the counter DerMend which he may apply to the forearms daily after bathing to see if this will minimize his  fragile skin bruising.  Neoplasm of uncertain behavior of skin Left Forearm - Posterior  Skin / nail biopsy Type of biopsy: tangential   Informed consent: discussed and consent obtained   Timeout: patient name, date of birth, surgical site, and procedure verified   Anesthesia: the lesion was anesthetized in a standard fashion   Anesthetic:  1% lidocaine w/ epinephrine 1-100,000 local infiltration Instrument used: flexible razor blade   Hemostasis achieved with: aluminum chloride and electrodesiccation   Outcome: patient tolerated procedure well   Post-procedure details: wound care instructions given    Specimen 1 - Surgical pathology Differential Diagnosis: bcc scc   Check Margins: No  Dermatofibroma Left Lower Leg - Posterior  Defer any treatment   Encounter for screening for malignant neoplasm of skin  Annual skin examination.      I, Lavonna Monarch, MD, have reviewed all documentation for this visit.  The documentation on 05/24/21 for the exam, diagnosis, procedures, and orders are all accurate and complete.

## 2021-06-02 DIAGNOSIS — Z23 Encounter for immunization: Secondary | ICD-10-CM | POA: Diagnosis not present

## 2021-06-22 DIAGNOSIS — H524 Presbyopia: Secondary | ICD-10-CM | POA: Diagnosis not present

## 2021-06-22 DIAGNOSIS — H04123 Dry eye syndrome of bilateral lacrimal glands: Secondary | ICD-10-CM | POA: Diagnosis not present

## 2021-06-22 DIAGNOSIS — Z961 Presence of intraocular lens: Secondary | ICD-10-CM | POA: Diagnosis not present

## 2021-07-03 ENCOUNTER — Ambulatory Visit: Payer: Medicare Other | Admitting: Dermatology

## 2021-07-12 ENCOUNTER — Encounter: Payer: Self-pay | Admitting: Dermatology

## 2021-07-12 ENCOUNTER — Other Ambulatory Visit: Payer: Self-pay

## 2021-07-12 ENCOUNTER — Ambulatory Visit (INDEPENDENT_AMBULATORY_CARE_PROVIDER_SITE_OTHER): Payer: Medicare Other | Admitting: Dermatology

## 2021-07-12 DIAGNOSIS — C44619 Basal cell carcinoma of skin of left upper limb, including shoulder: Secondary | ICD-10-CM | POA: Diagnosis not present

## 2021-07-12 NOTE — Patient Instructions (Signed)

## 2021-07-20 ENCOUNTER — Encounter: Payer: Medicare Other | Admitting: Dermatology

## 2021-08-06 ENCOUNTER — Encounter: Payer: Self-pay | Admitting: Dermatology

## 2021-08-06 NOTE — Progress Notes (Signed)
° °  Follow-Up Visit   Subjective  Michael Pace is a 76 y.o. male who presents for the following: Procedure (Patient here today for treatment of BCC x 1 on left forearm posterior).  BCC left arm Location:  Duration:  Quality:  Associated Signs/Symptoms: Modifying Factors:  Severity:  Timing: Context:   Objective  Well appearing patient in no apparent distress; mood and affect are within normal limits. Left Forearm - Posterior Lesion identified by Dr.Kelvin Sennett and nurse in room.      A focused examination was performed including head, neck, arms. Relevant physical exam findings are noted in the Assessment and Plan.   Assessment & Plan    Basal cell carcinoma (BCC) of skin of left upper extremity including shoulder Left Forearm - Posterior  Destruction of lesion Complexity: simple   Destruction method: electrodesiccation and curettage   Informed consent: discussed and consent obtained   Timeout:  patient name, date of birth, surgical site, and procedure verified Anesthesia: the lesion was anesthetized in a standard fashion   Anesthetic:  1% lidocaine w/ epinephrine 1-100,000 local infiltration Curettage performed in three different directions: Yes   Curettage cycles:  3 Lesion length (cm):  1.1 Lesion width (cm):  1.1 Margin per side (cm):  0 Final wound size (cm):  1.1 Hemostasis achieved with:  ferric subsulfate Outcome: patient tolerated procedure well with no complications   Post-procedure details: sterile dressing applied and wound care instructions given   Dressing type: bandage and petrolatum   Additional details:  Wound innoculated with 5 fluorouracil solution.      I, Lavonna Monarch, MD, have reviewed all documentation for this visit.  The documentation on 08/06/21 for the exam, diagnosis, procedures, and orders are all accurate and complete.

## 2021-09-03 ENCOUNTER — Encounter: Payer: Self-pay | Admitting: Family Medicine

## 2021-10-30 ENCOUNTER — Ambulatory Visit (INDEPENDENT_AMBULATORY_CARE_PROVIDER_SITE_OTHER): Payer: Medicare Other | Admitting: Dermatology

## 2021-10-30 ENCOUNTER — Encounter: Payer: Self-pay | Admitting: Dermatology

## 2021-10-30 DIAGNOSIS — Z1283 Encounter for screening for malignant neoplasm of skin: Secondary | ICD-10-CM

## 2021-10-30 DIAGNOSIS — Z85820 Personal history of malignant melanoma of skin: Secondary | ICD-10-CM | POA: Diagnosis not present

## 2021-10-30 DIAGNOSIS — L570 Actinic keratosis: Secondary | ICD-10-CM

## 2021-10-30 DIAGNOSIS — Z85828 Personal history of other malignant neoplasm of skin: Secondary | ICD-10-CM | POA: Diagnosis not present

## 2021-11-17 ENCOUNTER — Encounter: Payer: Self-pay | Admitting: Dermatology

## 2021-11-17 NOTE — Progress Notes (Signed)
? ?  Follow-Up Visit ?  ?Subjective  ?Michael Pace is a 76 y.o. male who presents for the following: Follow-up (F/u for bcc on left forearm. Personal history of bcc, scc, and melanoma. ). ? ?General skin check, history of multiple skin cancers, crust on arm ?Location:  ?Duration:  ?Quality:  ?Associated Signs/Symptoms: ?Modifying Factors:  ?Severity:  ?Timing: ?Context:  ? ?Objective  ?Well appearing patient in no apparent distress; mood and affect are within normal limits. ?Waist up skin examination: No new or recurrent atypical pigmented lesions (all checked with dermoscopy), no new or recurrent nonmelanoma skin cancer ? ?Left Forearm - Anterior ? ?Horn like 4 mm pink crust ? ? ? ?All skin waist up examined. ? ? ?Assessment & Plan  ? ? ?AK (actinic keratosis) ?Left Forearm - Anterior ? ?Destruction of lesion - Left Forearm - Anterior ?Complexity: simple   ?Destruction method: cryotherapy   ?Informed consent: discussed and consent obtained   ?Timeout:  patient name, date of birth, surgical site, and procedure verified ?Lesion destroyed using liquid nitrogen: Yes   ?Cryotherapy cycles:  3 ?Outcome: patient tolerated procedure well with no complications   ? ?Encounter for screening for malignant neoplasm of skin ? ?Annual skin examination. ? ? ? ? ? ?I, Lavonna Monarch, MD, have reviewed all documentation for this visit.  The documentation on 11/17/21 for the exam, diagnosis, procedures, and orders are all accurate and complete. ?

## 2022-05-25 ENCOUNTER — Encounter: Payer: Self-pay | Admitting: *Deleted

## 2022-05-30 ENCOUNTER — Ambulatory Visit: Payer: Medicare Other | Admitting: Dermatology

## 2022-06-25 ENCOUNTER — Telehealth: Payer: Self-pay | Admitting: Cardiology

## 2022-06-25 DIAGNOSIS — I77810 Thoracic aortic ectasia: Secondary | ICD-10-CM

## 2022-06-25 NOTE — Telephone Encounter (Signed)
Spoke with pt wife, aware CTA order placed and will schedule follow up appointment after the CTA has been scheduled.

## 2022-06-25 NOTE — Telephone Encounter (Signed)
  Pt' wife calling to f/u mychart message sent on 05/25/22. She wants to know if pt needs to get CTA then see Dr. Percival Spanish. Also there's no order for CTA

## 2022-07-03 NOTE — Telephone Encounter (Signed)
Follow up scheduled

## 2022-08-01 ENCOUNTER — Telehealth: Payer: Self-pay

## 2022-08-01 NOTE — Telephone Encounter (Signed)
S/w pt moved up Dr. Rosezella [place] appt to January to discuss surgical clearance.

## 2022-08-01 NOTE — Telephone Encounter (Signed)
   Name: Michael Pace  DOB: 07-09-1946  MRN: [id number]  Primary Cardiologist: None  Chart reviewed as part of pre-operative protocol coverage. Because of Michael Pace's past medical history and time since last visit, he will require a follow-up in-office visit in order to better assess preoperative cardiovascular risk.  Pre-op covering staff: - Please schedule appointment and call patient to inform them. If patient already had an upcoming appointment within acceptable timeframe, please add "pre-op clearance" to the appointment notes so provider is aware. - Please contact requesting surgeon's office via preferred method (i.e, phone, fax) to inform them of need for appointment prior to surgery.  No medications are indicated as needing held.  Elgie Collard, PA-C  08/01/2022, 2:37 PM

## 2022-08-01 NOTE — Telephone Encounter (Signed)
   Pre-operative Risk Assessment    Patient Name: Michael Pace  DOB: 1946-07-02 MRN: [id number]      Request for Surgical Clearance    Procedure:   Right reverse total shoulder arthroplasty   Date of Surgery:  Clearance TBD                                 Surgeon:  Dr.Justin Tamera Punt  Surgeon's Group or Practice Name:  [facility]  Phone number:  [phone] Fax number:  [phone] or [phone]   Type of Clearance Requested:   - Medical    Type of Anesthesia:   Choice    Additional requests/questions:    SignedJacqulynn Cadet   08/01/2022, 1:52 PM

## 2022-08-03 ENCOUNTER — Ambulatory Visit
Admission: RE | Admit: 2022-08-03 | Discharge: 2022-08-03 | Disposition: A | Discharge status: Home / Self Care | Payer: Medicare Other | Source: Ambulatory Visit | Attending: Cardiology | Admitting: Cardiology

## 2022-08-03 DIAGNOSIS — I77810 Thoracic aortic ectasia: Secondary | ICD-10-CM

## 2022-08-03 MED ORDER — IOPAMIDOL (ISOVUE-370) INJECTION 76%
75.0000 mL | Freq: Once | INTRAVENOUS | Status: AC | PRN
  Administered 2022-08-03: 75 mL via INTRAVENOUS

## 2022-08-14 NOTE — Progress Notes (Unsigned)
Cardiology Office Note   Date:  08/16/2022   ID:  Dadrian, Ballantine [birth year], MRN [id number]  PCP:  Geoffery Lyons, NP  Cardiologist:   None   Chief Complaint  Patient presents with   Pre-op Exam      History of Present Illness: Michael Pace is a 77 y.o. male who is referred by Geoffery Lyons, NP for evaluation of bradycardia and SOB.    He moved from [place].  He has a history of tachycardia.   He saw me because of palpitations.  He also had dyspnea.  He had his beta blocker decreased.    Echo demonstrated mild/mod MR and mild AI.  His EF was normal.  BNP and TSH were normal.  Records from [facility] demonstrated a perfusion study in 2007 with no ischemia.  Echocardiogram in 2006 and again in 2016 demonstrated a normal ejection fraction.  There is a report of mild coronary disease in 2002.  He had an SVT and an EP study in 2009.  No pathway was identified with no inducible arrhythmias.    Since I last saw him he has had no new cardiac complaints.  He goes to the [facility] and he walks.  He had no new shortness of breath, PND or orthopnea.  Has not had any symptomatic tachypalpitations.  He has had no exertional chest discomfort.  He gets some fleeting chest discomfort occasionally under his left breast.  This has been stable and sporadic pattern.  He has had no new weight gain or edema.  He is going to have a right shoulder replacement.  He will probably have his left done as well later this year.   Past Medical History:  Diagnosis Date   BPH (benign prostatic hyperplasia)    Cataracts, bilateral    GERD (gastroesophageal reflux disease)    Melanoma ([place]) 1988   LEFT ABDOMEN [place]- [place]   Nodular infiltrative basal cell carcinoma (BCC) 05/10/2021   Left Forearm - posterior   Squamous cell carcinoma of skin 03/29/2016   RIGHT SIDEBURN TX= CX3 5FU    Past Surgical History:  Procedure Laterality Date   SKIN CANCER EXCISION     Melanoma   TOTAL KNEE ARTHROPLASTY      bilateral     Current Outpatient Medications  Medication Sig Dispense Refill   amLODipine (NORVASC) 5 MG tablet Take 5 mg by mouth daily.     benazepril (LOTENSIN) 20 MG tablet Take 20 mg by mouth daily.     carvedilol (COREG) 12.5 MG tablet Take 6.25 mg by mouth 2 (two) times daily.      SHINGRIX injection      simvastatin (ZOCOR) 40 MG tablet Take 40 mg by mouth at bedtime.     meloxicam (MOBIC) 15 MG tablet Take 1 tablet (15 mg total) by mouth daily. (Patient not taking: Reported on 08/16/2022) 30 tablet 3   No current facility-administered medications for this visit.    Allergies:   Codeine and Penicillins    ROS:  Please see the history of present illness.   Otherwise, review of systems are positive for none.   All other systems are reviewed and negative.   His father is my patient   PHYSICAL EXAM: VS:  BP 128/70 (BP Location: Left Arm, Patient Position: Sitting, Cuff Size: Normal)   Pulse 61   Ht '5\' 10"'$  (1.778 m)   Wt 248 lb (112.5 kg)   SpO2 97%   BMI 35.58 kg/m  ,  BMI Body mass index is 35.58 kg/m. GENERAL:  Well appearing NECK:  No jugular venous distention, waveform within normal limits, carotid upstroke brisk and symmetric, no bruits, no thyromegaly LUNGS:  Clear to auscultation bilaterally CHEST:  Unremarkable HEART:  PMI not displaced or sustained,S1 and S2 within normal limits, no S3, no S4, no clicks, no rubs, 2 out of 6 apical and axillary systolic murmur, no diastolic murmurs ABD:  Flat, positive bowel sounds normal in frequency in pitch, no bruits, no rebound, no guarding, no midline pulsatile mass, no hepatomegaly, no splenomegaly EXT:  2 plus pulses throughout, no edema, no cyanosis no clubbing  EKG:  EKG is  ordered today. Sinus rhythm, rate 61, axis within normal limits, intervals within normal limits, poor anterior R wave progression, no acute ST-T wave changes.   Recent Labs: No results found for requested labs within last 365 days.    Lipid  Panel No results found for: "CHOL", "TRIG", "HDL", "CHOLHDL", "VLDL", "LDLCALC", "LDLDIRECT"    Wt Readings from Last 3 Encounters:  08/16/22 248 lb (112.5 kg)  06/23/20 251 lb (113.9 kg)  04/07/20 247 lb (112 kg)      Other studies Reviewed: Additional studies/ records that were reviewed today include: None Review of the above records demonstrates:  NA  ASSESSMENT AND PLAN:  BRADYCARDIA:   He has no symptoms related to this.  No change in therapy.  MR: This was mild on his echo in 2022.  I will repeat an echo this spring.  AORTIC ROOT :   This was stable on CT in Jan 2024.  I reviewed this recent CT and we will follow that up in a couple of years.   PREOP: Patient has no high risk findings.  He has a high functional level.  He is not going for high risk procedure.  Therefore, according to ACC/AHA guidelines no further cardiovascular testing is suggested prior to surgery.  He is at acceptable risk for the planned procedure.  Current medicines are reviewed at length with the patient today.  The patient does not have concerns regarding medicines.  The following changes have been made:    Labs/ tests ordered today include:   Orders Placed This Encounter  Procedures   EKG 12-Lead   ECHOCARDIOGRAM COMPLETE     Disposition:   FU with me in 12 months.     Signed, Minus Breeding, MD  08/16/2022 10:51 AM    [facility]

## 2022-08-16 ENCOUNTER — Ambulatory Visit: Payer: Medicare Other | Attending: Cardiology | Admitting: Cardiology

## 2022-08-16 ENCOUNTER — Encounter: Payer: Self-pay | Admitting: Cardiology

## 2022-08-16 VITALS — BP 128/70 | HR 61 | Ht 70.0 in | Wt 248.0 lb

## 2022-08-16 DIAGNOSIS — R001 Bradycardia, unspecified: Secondary | ICD-10-CM | POA: Insufficient documentation

## 2022-08-16 DIAGNOSIS — I77810 Thoracic aortic ectasia: Secondary | ICD-10-CM | POA: Diagnosis not present

## 2022-08-16 DIAGNOSIS — I340 Nonrheumatic mitral (valve) insufficiency: Secondary | ICD-10-CM | POA: Insufficient documentation

## 2022-08-16 NOTE — Patient Instructions (Addendum)
Medication Instructions:  Your physician recommends that you continue on your current medications as directed. Please refer to the Current Medication list given to you today.   *If you need a refill on your cardiac medications before your next appointment, please call your pharmacy*   Lab Work: NONE ordered at this time of appointment   If you have labs (blood work) drawn today and your tests are completely normal, you will receive your results only by: [place] (if you have MyChart) OR A paper copy in the mail If you have any lab test that is abnormal or we need to change your treatment, we will call you to review the results.   Testing/Procedures: Your physician has requested that you have an echocardiogram in June 2023. Echocardiography is a painless test that uses sound waves to create images of your heart. It provides your doctor with information about the size and shape of your heart and how well your heart's chambers and valves are working. This procedure takes approximately one hour. There are no restrictions for this procedure. Please do NOT wear cologne, perfume, aftershave, or lotions (deodorant is allowed). Please arrive 15 minutes prior to your appointment time.    Follow-Up: At [facility], you and your health needs are our priority.  As part of our continuing mission to provide you with exceptional heart care, we have created designated Provider Care Teams.  These Care Teams include your primary Cardiologist (physician) and Advanced Practice Providers (APPs -  Physician Assistants and Nurse Practitioners) who all work together to provide you with the care you need, when you need it.  We recommend signing up for the patient portal called "MyChart".  Sign up information is provided on this After Visit Summary.  MyChart is used to connect with patients for Virtual Visits (Telemedicine).  Patients are able to view lab/test results, encounter notes, upcoming  appointments, etc.  Non-urgent messages can be sent to your provider as well.   To learn more about what you can do with MyChart, go to [email or web address].    Your next appointment:   1 year(s)  Provider:   Dr. Percival Spanish   Other Instructions

## 2022-08-27 ENCOUNTER — Ambulatory Visit: Payer: Medicare Other | Admitting: Cardiology

## 2022-08-29 ENCOUNTER — Other Ambulatory Visit: Payer: Self-pay | Admitting: Orthopedic Surgery

## 2022-08-29 NOTE — Progress Notes (Signed)
Sent message, via epic in basket, requesting orders in epic from surgeon.  

## 2022-08-30 ENCOUNTER — Encounter (HOSPITAL_COMMUNITY): Payer: Self-pay

## 2022-08-30 NOTE — Patient Instructions (Addendum)
SURGICAL WAITING ROOM VISITATION Patients having surgery or a procedure may have no more than 2 support people in the waiting area - these visitors may rotate.    If the patient needs to stay at the hospital during part of their recovery, the visitor guidelines for inpatient rooms apply. Pre-op nurse will coordinate an appropriate time for 1 support person to accompany patient in pre-op.  This support person may not rotate.    Please refer to the [facility] website for the visitor guidelines for Inpatients (after your surgery is over and you are in a regular room).   Due to an increase in RSV and influenza rates and associated hospitalizations, children ages 68 and under may not visit patients in [place].     Your procedure is scheduled on: 09-13-22   Report to [facility] Main Entrance    Report to admitting at 7:15 AM   Call this number if you have problems the morning of surgery [phone]   Do not eat food :After Midnight.   After Midnight you may have the following liquids until 6:45 AM DAY OF SURGERY  Water Non-Citrus Juices (without pulp, NO RED) Carbonated Beverages Black Coffee (NO MILK/CREAM OR CREAMERS, sugar ok)  Clear Tea (NO MILK/CREAM OR CREAMERS, sugar ok) regular and decaf                             Plain Jell-O (NO RED)                                           Fruit ices (not with fruit pulp, NO RED)                                     Popsicles (NO RED)                                                               Sports drinks like Gatorade (NO RED)                   The day of surgery:  Drink ONE (1) Pre-Surgery Clear Ensure at 6:45 AM the morning of surgery. Drink in one sitting. Do not sip.  This drink was given to you during your hospital  pre-op appointment visit. Nothing else to drink after completing the Pre-Surgery Clear Ensure.          If you have questions, please contact your surgeon's office.   FOLLOW  ANY  ADDITIONAL PRE OP INSTRUCTIONS YOU RECEIVED FROM YOUR SURGEON'S OFFICE!!!     Oral Hygiene is also important to reduce your risk of infection.                                    Remember - BRUSH YOUR TEETH THE MORNING OF SURGERY WITH YOUR REGULAR TOOTHPASTE   Do NOT smoke after Midnight   Take these medicines the morning of surgery with A SIP OF WATER: Amlodipine Carvedilol  Rosuvastatin Okay to use eyedrops, nasal spray                               You may not have any metal on your body including  jewelry, and body piercing             Do not wear lotions, powders, cologne, or deodorant              Men may shave face and neck.   Do not bring valuables to the hospital. [place].   Contacts, dentures or bridgework may not be worn into surgery.   DO NOT [place]. PHARMACY WILL DISPENSE MEDICATIONS LISTED ON YOUR MEDICATION LIST TO YOU DURING YOUR ADMISSION [place]!    Patients discharged on the day of surgery will not be allowed to drive home.  Someone NEEDS to stay with you for the first 24 hours after anesthesia.   Special Instructions: Bring a copy of your healthcare power of attorney and living will documents the day of surgery if you haven't scanned them before.              Please read over the following fact sheets you were given: IF [facility]  If you received a COVID test during your pre-op visit  it is requested that you wear a mask when out in public, stay away from anyone that may not be feeling well and notify your surgeon if you develop symptoms. If you test positive for Covid or have been in contact with anyone that has tested positive in the last 10 days please notify you surgeon.  [place] - Preparing for Surgery Before surgery, you can play an important role.  Because skin is not sterile, your skin needs to be as  free of germs as possible.  You can reduce the number of germs on your skin by washing with CHG (chlorahexidine gluconate) soap before surgery.  CHG is an antiseptic cleaner which kills germs and bonds with the skin to continue killing germs even after washing. Please DO NOT use if you have an allergy to CHG or antibacterial soaps.  If your skin becomes reddened/irritated stop using the CHG and inform your nurse when you arrive at Short Stay. Do not shave (including legs and underarms) for at least 48 hours prior to the first CHG shower.  You may shave your face/neck.  Please follow these instructions carefully:  1.  Shower with CHG Soap the night before surgery and the  morning of surgery.  2.  If you choose to wash your hair, wash your hair first as usual with your normal  shampoo.  3.  After you shampoo, rinse your hair and body thoroughly to remove the shampoo.                             4.  Use CHG as you would any other liquid soap.  You can apply chg directly to the skin and wash.  Gently with a scrungie or clean washcloth.  5.  Apply the CHG Soap to your body ONLY FROM THE NECK DOWN.   Do   not use on face/ open  Wound or open sores. Avoid contact with eyes, ears mouth and   genitals (private parts).                       Wash face,  Genitals (private parts) with your normal soap.             6.  Wash thoroughly, paying special attention to the area where your    surgery  will be performed.  7.  Thoroughly rinse your body with warm water from the neck down.  8.  DO NOT shower/wash with your normal soap after using and rinsing off the CHG Soap.                9.  Pat yourself dry with a clean towel.            10.  Wear clean pajamas.            11.  Place clean sheets on your bed the night of your first shower and do not  sleep with pets. Day of Surgery : Do not apply any lotions/deodorants the morning of surgery.  Please wear clean clothes to the hospital/surgery  center.  FAILURE TO FOLLOW THESE INSTRUCTIONS MAY RESULT IN THE CANCELLATION OF YOUR SURGERY  PATIENT SIGNATURE_________________________________  NURSE SIGNATURE__________________________________  ________________________________________________________________________   [facility]- Preparing for Total Shoulder Arthroplasty    Before surgery, you can play an important role. Because skin is not sterile, your skin needs to be as free of germs as possible. You can reduce the number of germs on your skin by using the following products. Benzoyl Peroxide Gel Reduces the number of germs present on the skin Applied twice a day to shoulder area starting two days before surgery    ==================================================================  Please follow these instructions carefully:  BENZOYL PEROXIDE 5% GEL  Please do not use if you have an allergy to benzoyl peroxide.   If your skin becomes reddened/irritated stop using the benzoyl peroxide.  Starting two days before surgery, apply as follows: Apply benzoyl peroxide in the morning and at night. Apply after taking a shower. If you are not taking a shower clean entire shoulder front, back, and side along with the armpit with a clean wet washcloth.  Place a quarter-sized dollop on your shoulder and rub in thoroughly, making sure to cover the front, back, and side of your shoulder, along with the armpit.   2 days before ____ AM   ____ PM              1 day before ____ AM   ____ PM                         Do this twice a day for two days.  (Last application is the night before surgery, AFTER using the CHG soap as described below). Do NOT apply benzoyl peroxide gel on the day of surgery.     Incentive Spirometer  An incentive spirometer is a tool that can help keep your lungs clear and active. This tool measures how well you are filling your lungs with each breath. Taking long deep breaths may help reverse or decrease the chance of  developing breathing (pulmonary) problems (especially infection) following: A long period of time when you are unable to move or be active. BEFORE THE PROCEDURE  If the spirometer includes an indicator to show your best effort, your nurse or respiratory therapist will  set it to a desired goal. If possible, sit up straight or lean slightly forward. Try not to slouch. Hold the incentive spirometer in an upright position. INSTRUCTIONS FOR USE  Sit on the edge of your bed if possible, or sit up as far as you can in bed or on a chair. Hold the incentive spirometer in an upright position. Breathe out normally. Place the mouthpiece in your mouth and seal your lips tightly around it. Breathe in slowly and as deeply as possible, raising the piston or the ball toward the top of the column. Hold your breath for 3-5 seconds or for as long as possible. Allow the piston or ball to fall to the bottom of the column. Remove the mouthpiece from your mouth and breathe out normally. Rest for a few seconds and repeat Steps 1 through 7 at least 10 times every 1-2 hours when you are awake. Take your time and take a few normal breaths between deep breaths. The spirometer may include an indicator to show your best effort. Use the indicator as a goal to work toward during each repetition. After each set of 10 deep breaths, practice coughing to be sure your lungs are clear. If you have an incision (the cut made at the time of surgery), support your incision when coughing by placing a pillow or rolled up towels firmly against it. Once you are able to get out of bed, walk around indoors and cough well. You may stop using the incentive spirometer when instructed by your caregiver.  RISKS AND COMPLICATIONS Take your time so you do not get dizzy or light-headed. If you are in pain, you may need to take or ask for pain medication before doing incentive spirometry. It is harder to take a deep breath if you are having pain. AFTER  USE Rest and breathe slowly and easily. It can be helpful to keep track of a log of your progress. Your caregiver can provide you with a simple table to help with this. If you are using the spirometer at home, follow these instructions: [place] IF:  You are having difficultly using the spirometer. You have trouble using the spirometer as often as instructed. Your pain medication is not giving enough relief while using the spirometer. You develop fever of 100.5 F (38.1 C) or higher. SEEK IMMEDIATE MEDICAL CARE IF:  You cough up bloody sputum that had not been present before. You develop fever of 102 F (38.9 C) or greater. You develop worsening pain at or near the incision site. MAKE SURE YOU:  Understand these instructions. Will watch your condition. Will get help right away if you are not doing well or get worse. Document Released: 11/19/2006 Document Revised: 10/01/2011 Document Reviewed: 01/20/2007 [facility] Patient Information 2014 [place], [place].   ________________________________________________________________________

## 2022-08-30 NOTE — Progress Notes (Addendum)
COVID Vaccine Completed:  Yes  Date of COVID positive in last 22 days:No  PCP - Gilmore Laroche, MD Cardiologist - Minus Breeding, MD  Cardiac clearance in Epic dated 08-16-22 by Dr. Percival Spanish  Chest x-ray - CT chest 08-03-22 Epic EKG - 08-16-22 Epic Stress Test - Yes, in the past ECHO - 12-21-20 Epic Cardiac Cath - 2002 Pacemaker/ICD device last checked: Spinal Cord Stimulator: N/A  Bowel Prep - N/A  Sleep Study - Yes, mild +sleep apnea CPAP - No  Fasting Blood Sugar - N/A Checks Blood Sugar _____ times a day  Last dose of GLP1 agonist-  N/A GLP1 instructions:  N/A   Last dose of SGLT-2 inhibitors-  N/A SGLT-2 instructions: N/A  Blood Thinner Instructions: Aspirin Instructions:  ASA 81.  Patient stated he will stop a week before surgery  Last Dose:    Activity level:  Can go up a flight of stairs and perform activities of daily living without stopping and without symptoms of chest pain.  Able to exercise daily.  Patient states that he has chronic shortness of breath, has not worsened.    Anesthesia review:  Dilated aortic root, nonrheumatic mitral valve regurg, OSA, bradycardia, SOB  Patient denies shortness of breath, fever, cough and chest pain at PAT appointment  Patient verbalized understanding of instructions that were given to them at the PAT appointment. Patient was also instructed that they will need to review over the PAT instructions again at home before surgery.

## 2022-08-31 ENCOUNTER — Encounter (HOSPITAL_COMMUNITY): Payer: Self-pay

## 2022-08-31 ENCOUNTER — Encounter (HOSPITAL_COMMUNITY)
Admission: RE | Admit: 2022-08-31 | Discharge: 2022-08-31 | Disposition: A | Discharge status: Home / Self Care | Payer: Medicare Other | Source: Ambulatory Visit | Attending: Orthopedic Surgery | Admitting: Orthopedic Surgery

## 2022-08-31 ENCOUNTER — Other Ambulatory Visit: Payer: Self-pay

## 2022-08-31 VITALS — BP 123/79 | HR 59 | Temp 98.6°F | Resp 20 | Ht 70.0 in | Wt 245.8 lb

## 2022-08-31 DIAGNOSIS — I2510 Atherosclerotic heart disease of native coronary artery without angina pectoris: Secondary | ICD-10-CM | POA: Diagnosis not present

## 2022-08-31 DIAGNOSIS — Z01812 Encounter for preprocedural laboratory examination: Secondary | ICD-10-CM | POA: Diagnosis not present

## 2022-08-31 DIAGNOSIS — Z01818 Encounter for other preprocedural examination: Secondary | ICD-10-CM

## 2022-08-31 DIAGNOSIS — G4730 Sleep apnea, unspecified: Secondary | ICD-10-CM | POA: Insufficient documentation

## 2022-08-31 DIAGNOSIS — M19011 Primary osteoarthritis, right shoulder: Secondary | ICD-10-CM | POA: Insufficient documentation

## 2022-08-31 HISTORY — DX: Headache, unspecified: R51.9

## 2022-08-31 HISTORY — DX: Thoracic aortic ectasia: I77.810

## 2022-08-31 HISTORY — DX: Sleep apnea, unspecified: G47.30

## 2022-08-31 HISTORY — DX: Dyspnea, unspecified: R06.00

## 2022-08-31 HISTORY — DX: Bradycardia, unspecified: R00.1

## 2022-08-31 HISTORY — DX: Family history of other specified conditions: Z84.89

## 2022-08-31 HISTORY — DX: Unspecified osteoarthritis, unspecified site: M19.90

## 2022-08-31 HISTORY — DX: Nonrheumatic mitral (valve) insufficiency: I34.0

## 2022-08-31 LAB — CBC
HCT: 45.4 % (ref 39.0–52.0)
Hemoglobin: 14.7 g/dL (ref 13.0–17.0)
MCH: 30.7 pg (ref 26.0–34.0)
MCHC: 32.4 g/dL (ref 30.0–36.0)
MCV: 94.8 fL (ref 80.0–100.0)
Platelets: 171 10*3/uL (ref 150–400)
RBC: 4.79 MIL/uL (ref 4.22–5.81)
RDW: 12.9 % (ref 11.5–15.5)
WBC: 8.9 10*3/uL (ref 4.0–10.5)
nRBC: 0 % (ref 0.0–0.2)

## 2022-08-31 LAB — BASIC METABOLIC PANEL
Anion gap: 5 (ref 5–15)
BUN: 22 mg/dL (ref 8–23)
CO2: 27 mmol/L (ref 22–32)
Calcium: 9.3 mg/dL (ref 8.9–10.3)
Chloride: 106 mmol/L (ref 98–111)
Creatinine, Ser: 0.8 mg/dL (ref 0.61–1.24)
GFR, Estimated: >60 mL/min (ref >60)
Glucose, Bld: 107 mg/dL — ABNORMAL HIGH (ref 70–99)
Potassium: 5.2 mmol/L — ABNORMAL HIGH (ref 3.5–5.1)
Sodium: 138 mmol/L (ref 135–145)

## 2022-08-31 LAB — SURGICAL PCR SCREEN
MRSA, PCR: NEGATIVE
Staphylococcus aureus: POSITIVE — AB

## 2022-08-31 NOTE — Progress Notes (Signed)
PCR results sent to Dr. Tamera Punt to review.

## 2022-09-03 NOTE — Anesthesia Preprocedure Evaluation (Addendum)
Anesthesia Evaluation  Patient identified by MRN, date of birth, ID band Patient awake    Reviewed: Allergy & Precautions, NPO status , Patient's Chart, lab work & pertinent test results  Airway Mallampati: II  TM Distance: >3 FB Neck ROM: Full    Dental no notable dental hx. (+) Teeth Intact, Dental Advisory Given   Pulmonary sleep apnea    Pulmonary exam normal breath sounds clear to auscultation       Cardiovascular hypertension, Normal cardiovascular exam Rhythm:Regular Rate:Normal     Neuro/Psych  Headaches    GI/Hepatic Neg liver ROS,GERD  ,,  Endo/Other  negative endocrine ROS    Renal/GU Lab Results      Component                Value               Date                      CREATININE               0.80                08/31/2022                BUN                      22                  08/31/2022                NA                       138                 08/31/2022                K                        5.2 (H)             08/31/2022                Bladder dysfunction: PCN.      Musculoskeletal  (+) Arthritis ,    Abdominal   Peds  Hematology Lab Results      Component                Value               Date                      WBC                      8.9                 08/31/2022                HGB                      14.7                08/31/2022                HCT                      45.4  08/31/2022                MCV                      94.8                08/31/2022                PLT                      171                 08/31/2022              Anesthesia Other Findings All: codeine, PCN  Reproductive/Obstetrics                             Anesthesia Physical Anesthesia Plan  ASA: 3  Anesthesia Plan: General and Regional   Post-op Pain Management: Regional block* and Minimal or no pain anticipated   Induction: Intravenous  PONV Risk  Score and Plan: 3 and Treatment may vary due to age or medical condition and Ondansetron  Airway Management Planned: Oral ETT  Additional Equipment: None  Intra-op Plan:   Post-operative Plan: Extubation in OR  Informed Consent:      Dental advisory given  Plan Discussed with: CRNA and Anesthesiologist  Anesthesia Plan Comments: (See PAT note 08/31/2022  GA w R ISB)       Anesthesia Quick Evaluation

## 2022-09-03 NOTE — Progress Notes (Signed)
Anesthesia Chart Review   Case: [id number] Date/Time: 09/13/22 0930   Procedure: REVERSE SHOULDER ARTHROPLASTY (Right: Shoulder)   Anesthesia type: Choice   Pre-op diagnosis: RIGHT SHOULDER ARTHRITIS WITH ROTATOR CUFF PATHOLOGY   Location: [facility] ROOM 07 / [facility]   Surgeons: Tania Ade, MD       DISCUSSION:77 y.o. never smoker with h/o sleep apnea, right shoulder OA scheduled for above procedure 09/13/22 with Dr. Tania Ade.   Pt last seen by cardiology 08/16/2022. Per OV note, "MR: This was mild on his echo in 2022.  I will repeat an echo this spring.   AORTIC ROOT :   This was stable on CT in Jan 2024.  I reviewed this recent CT and we will follow that up in a couple of years.  PREOP: Patient has no high risk findings.  He has a high functional level.  He is not going for high risk procedure.  Therefore, according to ACC/AHA guidelines no further cardiovascular testing is suggested prior to surgery.  He is at acceptable risk for the planned procedure. "  Anticipate pt can proceed with planned procedure barring acute status change.   VS: BP 123/79   Pulse (!) 59   Temp 37 C (Oral)   Resp 20   Ht 5' 10"$  (1.778 m)   Wt 111.5 kg   SpO2 99%   BMI 35.27 kg/m   PROVIDERS: Combs, Diona Browner, DO is PCP   Minus Breeding, MD is Cardiologist  LABS: Labs reviewed: Acceptable for surgery. (all labs ordered are listed, but only abnormal results are displayed)  Labs Reviewed  SURGICAL PCR SCREEN - Abnormal; Notable for the following components:      Result Value   Staphylococcus aureus POSITIVE (*)    All other components within normal limits  BASIC METABOLIC PANEL - Abnormal; Notable for the following components:   Potassium 5.2 (*)    Glucose, Bld 107 (*)    All other components within normal limits  CBC     IMAGES:   EKG:   CV: Echo 12/21/20 1. Left ventricular ejection fraction, by estimation, is 60 to 65%. The  left ventricle has normal function. The left  ventricle has no regional  wall motion abnormalities. There is mild concentric left ventricular  hypertrophy. Left ventricular diastolic  parameters are indeterminate.   2. Right ventricular systolic function is normal. The right ventricular  size is normal. Tricuspid regurgitation signal is inadequate for assessing  PA pressure.   3. The mitral valve is abnormal- posterior prolapse with horizontal color  splay and eccentric mitral regurgitation. Mild to moderate mitral valve  regurgitation. No evidence of mitral stenosis.   4. The aortic valve is tricuspid. Aortic valve regurgitation is mild. No  aortic stenosis is present. Aortic regurgitation PHT measures 584 msec.   5. Aortic dilatation noted. There is moderate dilatation of the aortic  root, measuring 45 mm. There is mild dilatation of the ascending aorta,  measuring 42 mm.  Past Medical History:  Diagnosis Date   Arthritis    BPH (benign prostatic hyperplasia)    Bradycardia    Cataracts, bilateral    Dilated aortic root (HCC)    Dyspnea    Family history of adverse reaction to anesthesia    Dad slow to wake up   GERD (gastroesophageal reflux disease)    Headache    Melanoma ([place]) [place]   Nodular infiltrative basal cell carcinoma ([place]) 05/10/2021   Left  Forearm - posterior   Nonrheumatic mitral valve regurgitation    Sleep apnea    Squamous cell carcinoma of skin 03/29/2016   RIGHT SIDEBURN TX= CX3 5FU    Past Surgical History:  Procedure Laterality Date   Scrotal surgery     SHOULDER ARTHROSCOPY Right    SKIN CANCER EXCISION     Melanoma   TOTAL KNEE ARTHROPLASTY     bilateral    MEDICATIONS:  amLODipine (NORVASC) 5 MG tablet   aspirin EC 81 MG tablet   azelastine (ASTELIN) 0.1 % nasal spray   benazepril (LOTENSIN) 20 MG tablet   carvedilol (COREG) 12.5 MG tablet   hydroxypropyl methylcellulose / hypromellose (ISOPTO TEARS / GONIOVISC) 2.5 % ophthalmic solution   meloxicam (MOBIC)  15 MG tablet   Multiple Vitamin (MULTIVITAMIN WITH MINERALS) TABS tablet   multivitamin-lutein (OCUVITE-LUTEIN) CAPS capsule   psyllium (REGULOID) 0.52 g capsule   rosuvastatin (CRESTOR) 5 MG tablet   No current facility-administered medications for this encounter.    Konrad Felix Ward, PA-C [facility] [phone]

## 2022-09-13 ENCOUNTER — Ambulatory Visit (HOSPITAL_BASED_OUTPATIENT_CLINIC_OR_DEPARTMENT_OTHER): Payer: Medicare Other | Admitting: Anesthesiology

## 2022-09-13 ENCOUNTER — Encounter (HOSPITAL_COMMUNITY)
Admission: RE | Disposition: A | Discharge status: Home / Self Care | Payer: Self-pay | Source: Ambulatory Visit | Attending: Orthopedic Surgery

## 2022-09-13 ENCOUNTER — Other Ambulatory Visit: Payer: Self-pay

## 2022-09-13 ENCOUNTER — Ambulatory Visit (HOSPITAL_COMMUNITY)
Admission: RE | Admit: 2022-09-13 | Discharge: 2022-09-13 | Disposition: A | Discharge status: Home / Self Care | Payer: Medicare Other | Source: Ambulatory Visit | Attending: Orthopedic Surgery | Admitting: Orthopedic Surgery

## 2022-09-13 ENCOUNTER — Ambulatory Visit (HOSPITAL_COMMUNITY): Payer: Medicare Other | Admitting: Physician Assistant

## 2022-09-13 ENCOUNTER — Encounter (HOSPITAL_COMMUNITY): Payer: Self-pay | Admitting: Orthopedic Surgery

## 2022-09-13 DIAGNOSIS — I2510 Atherosclerotic heart disease of native coronary artery without angina pectoris: Secondary | ICD-10-CM

## 2022-09-13 DIAGNOSIS — M19011 Primary osteoarthritis, right shoulder: Secondary | ICD-10-CM

## 2022-09-13 DIAGNOSIS — G4730 Sleep apnea, unspecified: Secondary | ICD-10-CM | POA: Insufficient documentation

## 2022-09-13 DIAGNOSIS — R519 Headache, unspecified: Secondary | ICD-10-CM | POA: Diagnosis not present

## 2022-09-13 DIAGNOSIS — K219 Gastro-esophageal reflux disease without esophagitis: Secondary | ICD-10-CM | POA: Insufficient documentation

## 2022-09-13 DIAGNOSIS — M75101 Unspecified rotator cuff tear or rupture of right shoulder, not specified as traumatic: Secondary | ICD-10-CM | POA: Diagnosis not present

## 2022-09-13 DIAGNOSIS — I10 Essential (primary) hypertension: Secondary | ICD-10-CM | POA: Insufficient documentation

## 2022-09-13 DIAGNOSIS — Z01818 Encounter for other preprocedural examination: Secondary | ICD-10-CM

## 2022-09-13 HISTORY — PX: REVERSE SHOULDER ARTHROPLASTY: SHX5054

## 2022-09-13 SURGERY — ARTHROPLASTY, SHOULDER, TOTAL, REVERSE
Anesthesia: Regional | Site: Shoulder | Laterality: Right

## 2022-09-13 MED ORDER — ONDANSETRON HCL 4 MG/2ML IJ SOLN
INTRAMUSCULAR | Status: AC
  Filled 2022-09-13: qty 2

## 2022-09-13 MED ORDER — FENTANYL CITRATE PF 50 MCG/ML IJ SOSY
PREFILLED_SYRINGE | INTRAMUSCULAR | Status: AC
  Administered 2022-09-13: 50 ug via INTRAVENOUS
  Filled 2022-09-13: qty 3

## 2022-09-13 MED ORDER — LACTATED RINGERS IV SOLN: INTRAVENOUS | Status: DC

## 2022-09-13 MED ORDER — ONDANSETRON HCL 4 MG/2ML IJ SOLN
INTRAMUSCULAR | Status: DC | PRN
  Administered 2022-09-13: 4 mg via INTRAVENOUS

## 2022-09-13 MED ORDER — ROCURONIUM BROMIDE 100 MG/10ML IV SOLN
INTRAVENOUS | Status: DC | PRN
  Administered 2022-09-13: 60 mg via INTRAVENOUS

## 2022-09-13 MED ORDER — LIDOCAINE HCL (PF) 2 % IJ SOLN
INTRAMUSCULAR | Status: AC
  Filled 2022-09-13: qty 5

## 2022-09-13 MED ORDER — DEXMEDETOMIDINE HCL IN NACL 80 MCG/20ML IV SOLN
INTRAVENOUS | Status: DC | PRN
  Administered 2022-09-13: 8 ug via BUCCAL

## 2022-09-13 MED ORDER — AMISULPRIDE (ANTIEMETIC) 5 MG/2ML IV SOLN
INTRAVENOUS | Status: AC
  Filled 2022-09-13: qty 2

## 2022-09-13 MED ORDER — TIZANIDINE HCL 2 MG PO TABS: 2.0000 mg | ORAL_TABLET | Freq: Three times a day (TID) | ORAL | 0 refills | Status: DC | PRN

## 2022-09-13 MED ORDER — DEXAMETHASONE SODIUM PHOSPHATE 10 MG/ML IJ SOLN
INTRAMUSCULAR | Status: AC
  Filled 2022-09-13: qty 1

## 2022-09-13 MED ORDER — EPHEDRINE SULFATE (PRESSORS) 50 MG/ML IJ SOLN
INTRAMUSCULAR | Status: DC | PRN
  Administered 2022-09-13 (×2): 5 mg via INTRAVENOUS

## 2022-09-13 MED ORDER — SODIUM CHLORIDE 0.9 % IR SOLN
Status: DC | PRN
  Administered 2022-09-13: 1000 mL

## 2022-09-13 MED ORDER — ORAL CARE MOUTH RINSE: 15.0000 mL | Freq: Once | OROMUCOSAL | Status: AC

## 2022-09-13 MED ORDER — LIDOCAINE HCL (CARDIAC) PF 100 MG/5ML IV SOSY
PREFILLED_SYRINGE | INTRAVENOUS | Status: DC | PRN
  Administered 2022-09-13: 50 mg via INTRAVENOUS

## 2022-09-13 MED ORDER — ROCURONIUM BROMIDE 10 MG/ML (PF) SYRINGE
PREFILLED_SYRINGE | INTRAVENOUS | Status: AC
  Filled 2022-09-13: qty 10

## 2022-09-13 MED ORDER — PROPOFOL 10 MG/ML IV BOLUS
INTRAVENOUS | Status: AC
  Filled 2022-09-13: qty 20

## 2022-09-13 MED ORDER — METHOCARBAMOL 500 MG IVPB - SIMPLE MED
500.0000 mg | Freq: Four times a day (QID) | INTRAVENOUS | Status: DC | PRN
  Administered 2022-09-13: 500 mg via INTRAVENOUS

## 2022-09-13 MED ORDER — VANCOMYCIN HCL 1500 MG/300ML IV SOLN
1500.0000 mg | INTRAVENOUS | Status: DC
  Filled 2022-09-13: qty 300

## 2022-09-13 MED ORDER — PROPOFOL 10 MG/ML IV BOLUS
INTRAVENOUS | Status: DC | PRN
  Administered 2022-09-13: 130 mg via INTRAVENOUS

## 2022-09-13 MED ORDER — FENTANYL CITRATE PF 50 MCG/ML IJ SOSY
50.0000 ug | PREFILLED_SYRINGE | INTRAMUSCULAR | Status: DC
  Administered 2022-09-13 (×3): 50 ug via INTRAVENOUS
  Filled 2022-09-13: qty 2

## 2022-09-13 MED ORDER — ONDANSETRON HCL 4 MG/2ML IJ SOLN
4.0000 mg | Freq: Once | INTRAMUSCULAR | Status: AC | PRN
  Administered 2022-09-13: 4 mg via INTRAVENOUS

## 2022-09-13 MED ORDER — SUGAMMADEX SODIUM 200 MG/2ML IV SOLN
INTRAVENOUS | Status: DC | PRN
  Administered 2022-09-13: 50 mg via INTRAVENOUS
  Administered 2022-09-13: 200 mg via INTRAVENOUS

## 2022-09-13 MED ORDER — ACETAMINOPHEN 10 MG/ML IV SOLN
INTRAVENOUS | Status: AC
  Filled 2022-09-13: qty 100

## 2022-09-13 MED ORDER — ACETAMINOPHEN 10 MG/ML IV SOLN
1000.0000 mg | Freq: Once | INTRAVENOUS | Status: DC | PRN
  Administered 2022-09-13: 1000 mg via INTRAVENOUS

## 2022-09-13 MED ORDER — AMISULPRIDE (ANTIEMETIC) 5 MG/2ML IV SOLN
5.0000 mg | Freq: Once | INTRAVENOUS | Status: AC | PRN
  Administered 2022-09-13: 5 mg via INTRAVENOUS

## 2022-09-13 MED ORDER — TRANEXAMIC ACID-NACL 1000-0.7 MG/100ML-% IV SOLN
1000.0000 mg | INTRAVENOUS | Status: AC
  Administered 2022-09-13: 1000 mg via INTRAVENOUS
  Filled 2022-09-13: qty 100

## 2022-09-13 MED ORDER — FENTANYL CITRATE (PF) 100 MCG/2ML IJ SOLN
INTRAMUSCULAR | Status: DC | PRN
  Administered 2022-09-13: 50 ug via INTRAVENOUS
  Administered 2022-09-13 (×2): 25 ug via INTRAVENOUS

## 2022-09-13 MED ORDER — DEXMEDETOMIDINE HCL IN NACL 80 MCG/20ML IV SOLN
INTRAVENOUS | Status: AC
  Filled 2022-09-13: qty 20

## 2022-09-13 MED ORDER — FENTANYL CITRATE (PF) 100 MCG/2ML IJ SOLN
INTRAMUSCULAR | Status: AC
  Filled 2022-09-13: qty 2

## 2022-09-13 MED ORDER — DEXAMETHASONE SODIUM PHOSPHATE 10 MG/ML IJ SOLN
INTRAMUSCULAR | Status: DC | PRN
  Administered 2022-09-13: 8 mg via INTRAVENOUS

## 2022-09-13 MED ORDER — FENTANYL CITRATE PF 50 MCG/ML IJ SOSY: 25.0000 ug | PREFILLED_SYRINGE | INTRAMUSCULAR | Status: DC | PRN

## 2022-09-13 MED ORDER — PHENYLEPHRINE HCL (PRESSORS) 10 MG/ML IV SOLN
INTRAVENOUS | Status: DC | PRN
  Administered 2022-09-13: 80 ug via INTRAVENOUS
  Administered 2022-09-13 (×3): 40 ug via INTRAVENOUS

## 2022-09-13 MED ORDER — HYDROMORPHONE HCL 2 MG PO TABS: 2.0000 mg | ORAL_TABLET | ORAL | 0 refills | Status: AC | PRN

## 2022-09-13 MED ORDER — BUPIVACAINE HCL (PF) 0.5 % IJ SOLN
INTRAMUSCULAR | Status: DC | PRN
  Administered 2022-09-13: 15 mL via PERINEURAL

## 2022-09-13 MED ORDER — METHOCARBAMOL 500 MG IVPB - SIMPLE MED
INTRAVENOUS | Status: AC
  Filled 2022-09-13: qty 55

## 2022-09-13 MED ORDER — CHLORHEXIDINE GLUCONATE 0.12 % MT SOLN
15.0000 mL | Freq: Once | OROMUCOSAL | Status: AC
  Administered 2022-09-13: 15 mL via OROMUCOSAL

## 2022-09-13 MED ORDER — CEFAZOLIN SODIUM-DEXTROSE 2-4 GM/100ML-% IV SOLN
2.0000 g | Freq: Once | INTRAVENOUS | Status: AC
  Administered 2022-09-13: 2 g via INTRAVENOUS

## 2022-09-13 MED ORDER — EPHEDRINE 5 MG/ML INJ
INTRAVENOUS | Status: AC
  Filled 2022-09-13: qty 5

## 2022-09-13 MED ORDER — BUPIVACAINE LIPOSOME 1.3 % IJ SUSP
INTRAMUSCULAR | Status: DC | PRN
  Administered 2022-09-13: 10 mL via PERINEURAL

## 2022-09-13 MED ORDER — METHOCARBAMOL 500 MG PO TABS: 500.0000 mg | ORAL_TABLET | Freq: Four times a day (QID) | ORAL | Status: DC | PRN

## 2022-09-13 MED ORDER — WATER FOR IRRIGATION, STERILE IR SOLN
Status: DC | PRN
  Administered 2022-09-13: 2000 mL

## 2022-09-13 MED ORDER — 0.9 % SODIUM CHLORIDE (POUR BTL) OPTIME
TOPICAL | Status: DC | PRN
  Administered 2022-09-13: 1000 mL

## 2022-09-13 SURGICAL SUPPLY — 77 items
BAG COUNTER SPONGE SURGICOUNT (BAG) IMPLANT
BAG ZIPLOCK 12X15 (MISCELLANEOUS) ×1 IMPLANT
BASEPLATE P2 COATD GLND 6.5X30 (Shoulder) IMPLANT
BIT DRILL 1.6MX128 (BIT) IMPLANT
BIT DRILL 2.5 DIA 127 CALI (BIT) IMPLANT
BIT DRILL 4 DIA CALIBRATED (BIT) IMPLANT
BLADE SAW SGTL 73X25 THK (BLADE) ×1 IMPLANT
BOOTIES KNEE HIGH SLOAN (MISCELLANEOUS) ×2 IMPLANT
CLSR STERI-STRIP ANTIMIC 1/2X4 (GAUZE/BANDAGES/DRESSINGS) IMPLANT
COOLER ICEMAN CLASSIC (MISCELLANEOUS) IMPLANT
COVER BACK TABLE 60X90IN (DRAPES) ×1 IMPLANT
COVER SURGICAL LIGHT HANDLE (MISCELLANEOUS) ×1 IMPLANT
DRAPE INCISE IOBAN 66X45 STRL (DRAPES) ×1 IMPLANT
DRAPE ORTHO SPLIT 77X108 STRL (DRAPES) ×2
DRAPE POUCH INSTRU U-SHP 10X18 (DRAPES) ×1 IMPLANT
DRAPE SHEET LG 3/4 BI-LAMINATE (DRAPES) ×1 IMPLANT
DRAPE SURG 17X11 SM STRL (DRAPES) ×1 IMPLANT
DRAPE SURG ORHT 6 SPLT 77X108 (DRAPES) ×2 IMPLANT
DRAPE TOP 10253 STERILE (DRAPES) ×1 IMPLANT
DRAPE U-SHAPE 47X51 STRL (DRAPES) ×1 IMPLANT
DRSG AQUACEL AG ADV 3.5X 6 (GAUZE/BANDAGES/DRESSINGS) ×1 IMPLANT
DRSG AQUACEL AG ADV 3.5X10 (GAUZE/BANDAGES/DRESSINGS) IMPLANT
DURAPREP 26ML APPLICATOR (WOUND CARE) ×2 IMPLANT
ELECT BLADE TIP CTD 4 INCH (ELECTRODE) ×1 IMPLANT
ELECT REM PT RETURN 15FT ADLT (MISCELLANEOUS) ×1 IMPLANT
FACESHIELD WRAPAROUND (MASK) ×1 IMPLANT
FACESHIELD WRAPAROUND OR TEAM (MASK) ×1 IMPLANT
GLOVE BIO SURGEON STRL SZ7.5 (GLOVE) ×1 IMPLANT
GLOVE BIOGEL PI IND STRL 6.5 (GLOVE) ×1 IMPLANT
GLOVE BIOGEL PI IND STRL 8 (GLOVE) ×1 IMPLANT
GLOVE SURG SS PI 6.5 STRL IVOR (GLOVE) ×1 IMPLANT
GOWN STRL REUS W/ TWL LRG LVL3 (GOWN DISPOSABLE) ×1 IMPLANT
GOWN STRL REUS W/ TWL XL LVL3 (GOWN DISPOSABLE) ×1 IMPLANT
GOWN STRL REUS W/TWL LRG LVL3 (GOWN DISPOSABLE) ×1
GOWN STRL REUS W/TWL XL LVL3 (GOWN DISPOSABLE) ×1
HANDPIECE INTERPULSE COAX TIP (DISPOSABLE) ×1
HEAD GLENOID W/SCREW 32MM (Shoulder) IMPLANT
HOOD PEEL AWAY T7 (MISCELLANEOUS) ×3 IMPLANT
INSERT EPOLY STND HUMERUS 36MM (Shoulder) ×1 IMPLANT
INSERT EPOLYSTD HUMERUS 36MM (Shoulder) IMPLANT
KIT BASIN OR (CUSTOM PROCEDURE TRAY) ×1 IMPLANT
KIT TURNOVER KIT A (KITS) IMPLANT
MANIFOLD NEPTUNE II (INSTRUMENTS) ×1 IMPLANT
NDL TROCAR POINT SZ 2 1/2 (NEEDLE) IMPLANT
NEEDLE TROCAR POINT SZ 2 1/2 (NEEDLE) IMPLANT
NS IRRIG 1000ML POUR BTL (IV SOLUTION) ×1 IMPLANT
P2 COATDE GLNOID BSEPLT 6.5X30 (Shoulder) ×1 IMPLANT
PACK SHOULDER (CUSTOM PROCEDURE TRAY) ×1 IMPLANT
PAD COLD SHLDR WRAP-ON (PAD) IMPLANT
PROTECTOR NERVE ULNAR (MISCELLANEOUS) IMPLANT
RESTRAINT HEAD UNIVERSAL NS (MISCELLANEOUS) ×1 IMPLANT
RETRIEVER SUT HEWSON (MISCELLANEOUS) IMPLANT
SCREW BONE LOCKING RSP 5.0X30 (Screw) ×1 IMPLANT
SCREW BONE RSP LOCK 5X18 (Screw) IMPLANT
SCREW BONE RSP LOCK 5X22 (Screw) IMPLANT
SCREW BONE RSP LOCK 5X30 (Screw) IMPLANT
SCREW BONE RSP LOCKING 18MM LG (Screw) ×1 IMPLANT
SCREW BONE RSP LOCKING 5.0X32 (Screw) ×2 IMPLANT
SET HNDPC FAN SPRY TIP SCT (DISPOSABLE) ×1 IMPLANT
SLING ARM IMMOBILIZER LRG (SOFTGOODS) IMPLANT
SLING ARM IMMOBILIZER MED (SOFTGOODS) IMPLANT
STEM HUMERAL STD SHELL 14X48 (Miscellaneous) IMPLANT
STRIP CLOSURE SKIN 1/2X4 (GAUZE/BANDAGES/DRESSINGS) ×2 IMPLANT
SUCTION FRAZIER HANDLE 10FR (MISCELLANEOUS)
SUCTION TUBE FRAZIER 10FR DISP (MISCELLANEOUS) IMPLANT
SUPPORT WRAP ARM LG (MISCELLANEOUS) ×1 IMPLANT
SUT ETHIBOND 2 V 37 (SUTURE) IMPLANT
SUT FIBERWIRE #2 38 REV NDL BL (SUTURE)
SUT MNCRL AB 4-0 PS2 18 (SUTURE) ×1 IMPLANT
SUT VIC AB 2-0 CT1 27 (SUTURE) ×2
SUT VIC AB 2-0 CT1 TAPERPNT 27 (SUTURE) ×2 IMPLANT
SUTURE FIBERWR#2 38 REV NDL BL (SUTURE) IMPLANT
TAPE LABRALWHITE 1.5X36 (TAPE) IMPLANT
TAPE SUT LABRALTAP WHT/BLK (SUTURE) IMPLANT
TOWEL OR 17X26 10 PK STRL BLUE (TOWEL DISPOSABLE) ×1 IMPLANT
TOWEL OR NON WOVEN STRL DISP B (DISPOSABLE) ×1 IMPLANT
WATER STERILE IRR 1000ML POUR (IV SOLUTION) ×1 IMPLANT

## 2022-09-13 NOTE — Anesthesia Procedure Notes (Signed)
Procedure Name: Intubation Date/Time: 09/13/2022 10:22 AM  Performed by: Garrel Ridgel, CRNAPre-anesthesia Checklist: Patient identified, Emergency Drugs available, Suction available and Patient being monitored Patient Re-evaluated:Patient Re-evaluated prior to induction Oxygen Delivery Method: Circle system utilized Preoxygenation: Pre-oxygenation with 100% oxygen Induction Type: IV induction Ventilation: Mask ventilation without difficulty Laryngoscope Size: Mac and 4 Grade View: Grade II Tube type: Oral Tube size: 7.5 mm Number of attempts: 1 Airway Equipment and Method: Stylet Placement Confirmation: ETT inserted through vocal cords under direct vision, positive ETCO2 and breath sounds checked- equal and bilateral Secured at: 23 cm Tube secured with: Tape Dental Injury: Teeth and Oropharynx as per pre-operative assessment

## 2022-09-13 NOTE — Op Note (Signed)
Procedure(s): REVERSE SHOULDER ARTHROPLASTY Procedure Note  Michael Pace male 77 y.o. 09/13/2022  Preoperative diagnosis: Right shoulder end-stage osteoarthritis with rotator cuff disease  Postop diagnosis: Same  Procedure(s) and Anesthesia Type:    * REVERSE SHOULDER ARTHROPLASTY - General   Indications:  77 y.o. male  With endstage right shoulder arthritis with rotator cuff disease. Pain and dysfunction interfered with quality of life and nonoperative treatment with activity modification, NSAIDS and injections failed.     Surgeon: Rhae Hammock   Assistants: Sheryle Hail PA-C Amber was present and scrubbed throughout the procedure and was essential in positioning, retraction, exposure, and closure)  Anesthesia: General endotracheal anesthesia with preoperative interscalene block given by the attending anesthesiologist      Procedure Detail  REVERSE SHOULDER ARTHROPLASTY   Estimated Blood Loss:  200 mL         Drains: none  Blood Given: none          Specimens: none        Complications:  * No complications entered in OR log *         Disposition: PACU - hemodynamically stable.         Condition: stable      OPERATIVE FINDINGS:  A DJO Altivate pressfit reverse total shoulder arthroplasty was placed with a  size 14 stem, a 36 glenosphere, and a standard-mm poly insert. The base plate  fixation was excellent.  PROCEDURE: The patient was identified in the preoperative holding area  where I personally marked the operative site after verifying site, side,  and procedure with the patient. An interscalene block given by  the attending anesthesiologist in the holding area and the patient was taken back to the operating room where all extremities were  carefully padded in position after general anesthesia was induced. She  was placed in a beach-chair position and the operative upper extremity was  prepped and draped in a standard sterile fashion. An  approximately 10-  cm incision was made from the tip of the coracoid process to the center  point of the humerus at the level of the axilla. Dissection was carried  down through subcutaneous tissues to the level of the cephalic vein  which was taken laterally with the deltoid. The pectoralis major was  retracted medially. The subdeltoid space was developed and the lateral  edge of the conjoined tendon was identified. The undersurface of  conjoined tendon was palpated and the musculocutaneous nerve was not in  the field. Retractor was placed underneath the conjoined and second  retractor was placed lateral into the deltoid. The circumflex humeral  artery and vessels were identified and clamped and coagulated. The  biceps tendon was severely thinned and was tenotomized.  The subscapularis was taken down as a peel with the underlying capsule.  The  joint was then gently externally rotated while the capsule was released  from the humeral neck around to just beyond the 6 o'clock position. At  this point, the joint was dislocated and the humeral head was presented  into the wound. The excessive osteophyte formation was removed with a  large rongeur.  The cutting guide was used to make the appropriate  head cut and the head was saved for potentially bone grafting.  The glenoid was exposed with the arm in an  abducted extended position. The anterior and posterior labrum were  completely excised and the capsule was released circumferentially to  allow for exposure of the glenoid for preparation. The 2.5 mm  drill was  placed using the guide in 5-10 inferior angulation and the tap was then advanced in the same hole. Small and large reamers were then used. The tap was then removed and the Metaglene was then screwed in with excellent purchase.  The peripheral guide was then used to drilled measured and filled peripheral locking screws. The size  36  glenosphere was then impacted on the [facility] taper and the  central screw was placed. The humerus was then again exposed and the diaphyseal reamers were used followed by the metaphyseal reamers. The final broach was left in place in the proximal trial was placed. The joint was reduced and with this implant it was felt that soft tissue tensioning was appropriate with excellent stability and excellent range of motion. Therefore, final humeral stem was placed press-fit.  And then the trial polyethylene inserts were tested again and the above implant was felt to be the most appropriate for final insertion. The joint was reduced taken through full range of motion and felt to be stable. Soft tissue tension was appropriate.  The joint was then copiously irrigated with pulse  lavage and the wound was then closed. The subscapularis was loosely repaired with 1 FiberWire through bone tunnel.  Skin was closed with 2-0 Vicryl in a deep dermal layer and 4-0  Monocryl for skin closure. Steri-Strips were applied. Sterile  dressings were then applied as well as a sling. The patient was allowed  to awaken from general anesthesia, transferred to stretcher, and taken  to recovery room in stable condition.   POSTOPERATIVE PLAN: The patient will be observed in the recovery room and if his pain is well-controlled and he is hemodynamically stable he could be discharged home today with family.

## 2022-09-13 NOTE — H&P (Signed)
Michael Pace is an 77 y.o. male.   Chief Complaint: Right shoulder pain and dysfunction HPI: Endstage R shoulder arthritis with significant rotator cuff disease, pain and dysfunction, failed conservative measures.  Pain interferes with sleep and quality of life.   Past Medical History:  Diagnosis Date   Arthritis    BPH (benign prostatic hyperplasia)    Bradycardia    Cataracts, bilateral    Dilated aortic root (HCC)    Dyspnea    Family history of adverse reaction to anesthesia    Dad slow to wake up   GERD (gastroesophageal reflux disease)    Headache    Melanoma ([place]) [place]   Nodular infiltrative basal cell carcinoma ([facility]) 05/10/2021   Left Forearm - posterior   Nonrheumatic mitral valve regurgitation    Sleep apnea    Squamous cell carcinoma of skin 03/29/2016   RIGHT SIDEBURN TX= CX3 5FU    Past Surgical History:  Procedure Laterality Date   Scrotal surgery     SHOULDER ARTHROSCOPY Right    SKIN CANCER EXCISION     Melanoma   TOTAL KNEE ARTHROPLASTY     bilateral    Family History  Problem Relation Age of Onset   Cancer Mother        No primary   Social History:  reports that he has never smoked. He has never used smokeless tobacco. He reports current alcohol use. He reports that he does not use drugs.  Allergies:  Allergies  Allergen Reactions   Codeine Hives, Itching and Swelling    headache   Penicillins Hives, Itching and Swelling    headache    Medications Prior to Admission  Medication Sig Dispense Refill   amLODipine (NORVASC) 5 MG tablet Take 5 mg by mouth daily.     aspirin EC 81 MG tablet Take 81 mg by mouth daily. Swallow whole.     azelastine (ASTELIN) 0.1 % nasal spray Place 1 spray into both nostrils daily as needed for rhinitis or allergies. Use in each nostril as directed     benazepril (LOTENSIN) 20 MG tablet Take 20 mg by mouth daily.     carvedilol (COREG) 12.5 MG tablet Take 12.5 mg by mouth 2 (two)  times daily.     hydroxypropyl methylcellulose / hypromellose (ISOPTO TEARS / GONIOVISC) 2.5 % ophthalmic solution Place 1 drop into both eyes as needed for dry eyes.     Multiple Vitamin (MULTIVITAMIN WITH MINERALS) TABS tablet Take 1 tablet by mouth daily.     multivitamin-lutein (OCUVITE-LUTEIN) CAPS capsule Take 1 capsule by mouth daily.     psyllium (REGULOID) 0.52 g capsule Take 0.52 g by mouth daily.     rosuvastatin (CRESTOR) 5 MG tablet Take 5 mg by mouth daily.     meloxicam (MOBIC) 15 MG tablet Take 1 tablet (15 mg total) by mouth daily. (Patient not taking: Reported on 08/16/2022) 30 tablet 3    No results found for this or any previous visit (from the past 48 hour(s)). No results found.  Review of Systems  All other systems reviewed and are negative.   Blood pressure 132/86, pulse (!) 57, temperature 98.7 F (37.1 C), resp. rate 17, height 5' 10"$  (1.778 m), weight 111.5 kg, SpO2 97 %. Physical Exam Constitutional:      Appearance: He is well-developed.  HENT:     Head: Atraumatic.  Eyes:     Extraocular Movements: Extraocular movements intact.  Cardiovascular:  Pulses: Normal pulses.  Pulmonary:     Effort: Pulmonary effort is normal.  Musculoskeletal:     Comments: Right shoulder pain with limited ROM. NVID.  Skin:    General: Skin is warm and dry.  Neurological:     Mental Status: He is alert and oriented to person, place, and time.  Psychiatric:        Mood and Affect: Mood normal.      Assessment/Plan Endstage R shoulder arthritis with significant rotator cuff disease Plan right reverse total shoulder arthroplasty Risks / benefits of surgery discussed Consent on chart  NPO for OR Preop antibiotics   Rhae Hammock, MD 09/13/2022, 9:13 AM

## 2022-09-13 NOTE — Anesthesia Procedure Notes (Signed)
Anesthesia Regional Block: Interscalene brachial plexus block   Pre-Anesthetic Checklist: , timeout performed,  Correct Patient, Correct Site, Correct Laterality,  Correct Procedure, Correct Position, site marked,  Risks and benefits discussed,  Surgical consent,  Pre-op evaluation,  At surgeon's request and post-op pain management  Laterality: Upper and Right  Prep: Maximum Sterile Barrier Precautions used, chloraprep       Needles:  Injection technique: Single-shot  Needle Type: Echogenic Needle     Needle Length: 5cm  Needle Gauge: 21     Additional Needles:   Procedures:,,,, ultrasound used (permanent image in chart),,    Narrative:  Start time: 09/13/2022 8:42 AM End time: 09/13/2022 8:50 AM Injection made incrementally with aspirations every 5 mL.  Performed by: Personally  Anesthesiologist: Barnet Glasgow, MD  Additional Notes: Block assessed prior to procedure. Patient tolerated procedure well.

## 2022-09-13 NOTE — Transfer of Care (Signed)
Immediate Anesthesia Transfer of Care Note  Patient: Michael Pace  Procedure(s) Performed: REVERSE SHOULDER ARTHROPLASTY (Right: Shoulder)  Patient Location: PACU  Anesthesia Type:General  Level of Consciousness: awake, drowsy, and patient cooperative  Airway & Oxygen Therapy: Patient Spontanous Breathing and Patient connected to face mask oxygen  Post-op Assessment: Report given to RN and Post -op Vital signs reviewed and stable  Post vital signs: Reviewed and stable  Last Vitals:  Vitals Value Taken Time  BP 138/82 09/13/22 1152  Temp 36.3 C 09/13/22 1152  Pulse 62 09/13/22 1153  Resp 21 09/13/22 1153  SpO2 100 % 09/13/22 1153  Vitals shown include unvalidated device data.  Last Pain:  Vitals:   09/13/22 1152  TempSrc: Oral  PainSc:          Complications: No notable events documented.

## 2022-09-13 NOTE — Discharge Instructions (Addendum)

## 2022-09-14 ENCOUNTER — Encounter (HOSPITAL_COMMUNITY): Payer: Self-pay | Admitting: Orthopedic Surgery

## 2022-09-14 NOTE — Anesthesia Postprocedure Evaluation (Signed)
Anesthesia Post Note  Patient: Michael Pace  Procedure(s) Performed: REVERSE SHOULDER ARTHROPLASTY (Right: Shoulder)     Patient location during evaluation: PACU Anesthesia Type: Regional and General Level of consciousness: awake and alert Pain management: pain level controlled Vital Signs Assessment: post-procedure vital signs reviewed and stable Respiratory status: spontaneous breathing, nonlabored ventilation, respiratory function stable and patient connected to nasal cannula oxygen Cardiovascular status: blood pressure returned to baseline and stable Postop Assessment: no apparent nausea or vomiting Anesthetic complications: no  No notable events documented.  Last Vitals:  Vitals:   09/13/22 1400 09/13/22 1415  BP: 117/72 120/74  Pulse: (!) 57 63  Resp:    Temp:    SpO2: 92% 93%    Last Pain:  Vitals:   09/13/22 1415  TempSrc:   PainSc: 3    Pain Goal: Patients Stated Pain Goal: 4 (09/13/22 1300)                 Barnet Glasgow

## 2022-11-08 IMAGING — NM NM BONE 3 PHASE
10 series · 20 of 20 positions shown · non-contrast
Comparison: 06/07/2020

CLINICAL DATA: Bilateral knee replacements 0003 and 9006, worsening
knee pain

EXAM:
NUCLEAR MEDICINE 3-PHASE BONE SCAN
TECHNIQUE: Radionuclide angiographic images, immediate static blood pool
images, and 3-hour delayed static images were obtained of the
bilateral knees after intravenous injection of radiopharmaceutical.
RADIOPHARMACEUTICALS:  21.7 mCi Ec-FFm MDP IV

[Series 1: flow · 2.07mm/px · 6 of 48 frames shown (1 of 2)]
[frame 5/48]
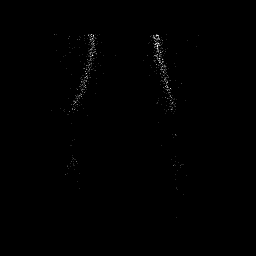
[frame 13/48]
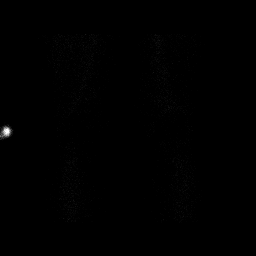
[frame 21/48]
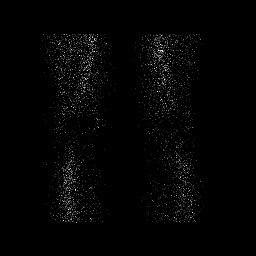
[frame 29/48]
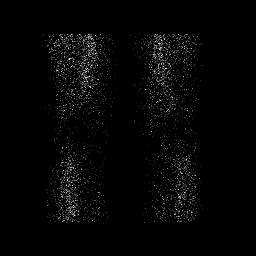
[frame 37/48]
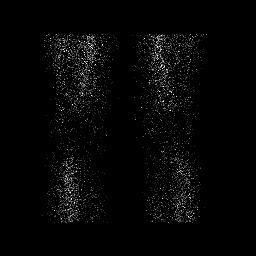
[frame 45/48]
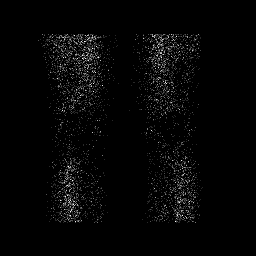

[Series 1: flow · 2.07mm/px · 6 of 48 frames shown (2 of 2)]
[frame 5/48]
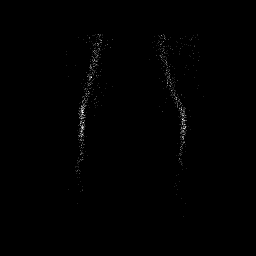
[frame 13/48]
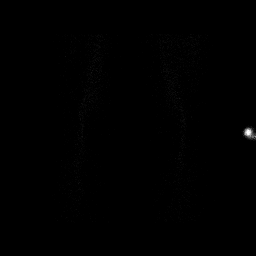
[frame 21/48]
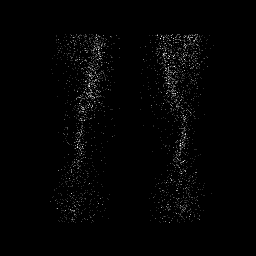
[frame 29/48]
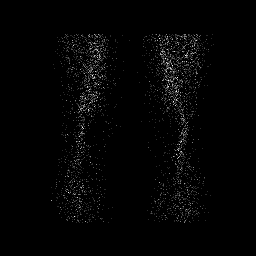
[frame 37/48]
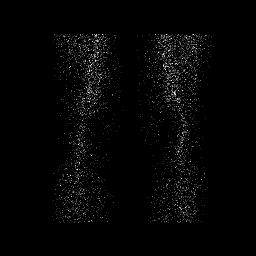
[frame 45/48]
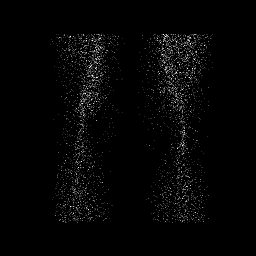

[Series 2: blood pool · 2.07mm/px · 1 of 1 slices shown (1 of 2)]
[im 1/1]
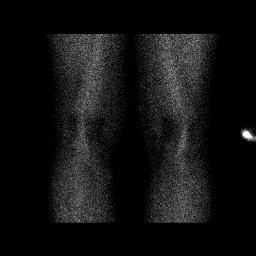

[Series 2: blood pool · 2.07mm/px · 1 of 1 slices shown (2 of 2)]
[im 1/1]
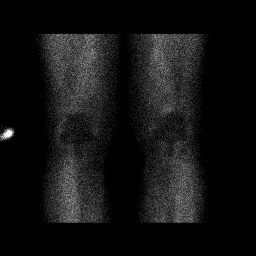

[Series 3: lat bp · 2.07mm/px · 1 of 1 slices shown (1 of 2)]
[im 1/1]
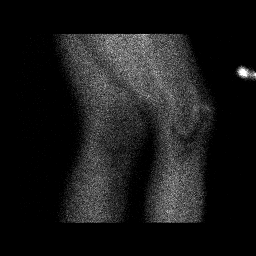

[Series 3: lat bp · 2.07mm/px · 1 of 1 slices shown (2 of 2)]
[im 1/1]
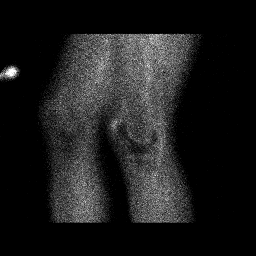

[Series 4: delay · delayed · 2.07mm/px · 1 of 1 slices shown (1 of 4)]
[im 1/1]
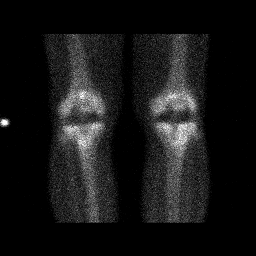

[Series 4: delay · delayed · 2.07mm/px · 1 of 1 slices shown (2 of 4)]
[im 1/1]
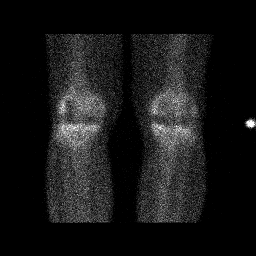

[Series 5: delay · delayed · 2.07mm/px · 1 of 1 slices shown (3 of 4)]
[im 1/1]
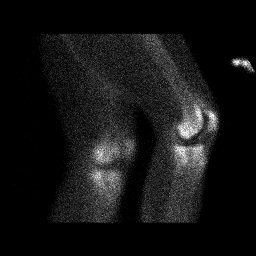

[Series 5: delay · delayed · 2.07mm/px · 1 of 1 slices shown (4 of 4)]
[im 1/1]
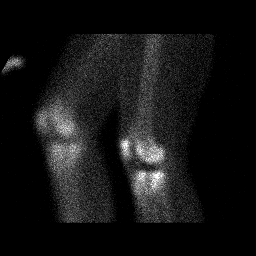

[20 of 20 positions shown; findings below may reference images not displayed]

FINDINGS: Vascular phase: Normal radiotracer distribution.

Blood pool phase: Photopenia related to bilateral hip
arthroplasties. No abnormal radiotracer deposition.

Delayed phase: Photopenia related to bilateral knee arthroplasties.
Low level activity surrounding the arthroplasties consistent with
postoperative change. No focal activity to suggest loosening or
infection.
IMPRESSION: 1. Unremarkable three-phase bone scan. No evidence of bilateral knee
prosthesis infection or loosening.

## 2023-01-07 ENCOUNTER — Ambulatory Visit (HOSPITAL_COMMUNITY): Payer: Medicare Other | Attending: Cardiology

## 2023-01-07 DIAGNOSIS — I340 Nonrheumatic mitral (valve) insufficiency: Secondary | ICD-10-CM | POA: Insufficient documentation

## 2023-01-07 LAB — ECHOCARDIOGRAM COMPLETE
Area-P 1/2: 2.53 cm2
MV M vel: 2.8 m/s
MV Peak grad: 31.4 mmHg
P 1/2 time: 750 msec
S' Lateral: 3.6 cm

## 2023-01-11 ENCOUNTER — Encounter: Payer: Self-pay | Admitting: *Deleted

## 2023-01-15 ENCOUNTER — Other Ambulatory Visit (HOSPITAL_COMMUNITY): Payer: Medicare Other

## 2023-02-22 ENCOUNTER — Telehealth: Payer: Self-pay | Admitting: Cardiology

## 2023-02-22 NOTE — Telephone Encounter (Signed)
Patient will have surgeon to fax over clearance form

## 2023-02-22 NOTE — Telephone Encounter (Signed)
Pt is requesting a callback to see if he should be seen in the office before his next shoulder surgery in the fall. Please advise

## 2023-04-12 ENCOUNTER — Telehealth: Payer: Self-pay | Admitting: Cardiology

## 2023-04-12 NOTE — Telephone Encounter (Signed)
Pre-operative Risk Assessment    Patient Name: Michael Pace  DOB: [birth year] MRN: [id number]      Request for Surgical Clearance    Procedure:   Left reverse total shoulder arthroplasty  Date of Surgery:  Clearance TBD                                 Surgeon:  Dr. Ave Filter Surgeon's Group or Practice Name:  [facility]  Phone number:  [phone]  Fax number:  [phone]   Type of Clearance Requested:   - Medical  - Pharmacy:  Hold        Type of Anesthesia:   Choice   Additional requests/questions:   Caller stated they will need medical and pharmacy clearance.  Signed, Annetta Maw   04/12/2023, 10:53 AM

## 2023-04-15 ENCOUNTER — Telehealth: Payer: Self-pay

## 2023-04-15 NOTE — Telephone Encounter (Signed)
Pt is scheduled for tele preop 10/15 at 9:40am. Pt states surgery will be end of Oct early nov. Med rec and consent done    Patient Consent for Virtual Visit        Onofrio Tessendorf has provided verbal consent on 04/15/2023 for a virtual visit (video or telephone).   CONSENT FOR VIRTUAL VISIT FOR:  Christen Butter  By participating in this virtual visit I agree to the following:  I hereby voluntarily request, consent and authorize [facility] and its employed or contracted physicians, physician assistants, nurse practitioners or other licensed health care professionals (the Practitioner), to provide me with telemedicine health care services (the "Services") as deemed necessary by the treating Practitioner. I acknowledge and consent to receive the Services by the Practitioner via telemedicine. I understand that the telemedicine visit will involve communicating with the Practitioner through live audiovisual communication technology and the disclosure of certain medical information by electronic transmission. I acknowledge that I have been given the opportunity to request an in-person assessment or other available alternative prior to the telemedicine visit and am voluntarily participating in the telemedicine visit.  I understand that I have the right to withhold or withdraw my consent to the use of telemedicine in the course of my care at any time, without affecting my right to future care or treatment, and that the Practitioner or I may terminate the telemedicine visit at any time. I understand that I have the right to inspect all information obtained and/or recorded in the course of the telemedicine visit and may receive copies of available information for a reasonable fee.  I understand that some of the potential risks of receiving the Services via telemedicine include:  Delay or interruption in medical evaluation due to technological equipment failure or disruption; Information  transmitted may not be sufficient (e.g. poor resolution of images) to allow for appropriate medical decision making by the Practitioner; and/or  In rare instances, security protocols could fail, causing a breach of personal health information.  Furthermore, I acknowledge that it is my responsibility to provide information about my medical history, conditions and care that is complete and accurate to the best of my ability. I acknowledge that Practitioner's advice, recommendations, and/or decision may be based on factors not within their control, such as incomplete or inaccurate data provided by me or distortions of diagnostic images or specimens that may result from electronic transmissions. I understand that the practice of medicine is not an exact science and that Practitioner makes no warranties or guarantees regarding treatment outcomes. I acknowledge that a copy of this consent can be made available to me via my patient portal [facility] MyChart), or I can request a printed copy by calling the office of [facility].    I understand that my insurance will be billed for this visit.   I have read or had this consent read to me. I understand the contents of this consent, which adequately explains the benefits and risks of the Services being provided via telemedicine.  I have been provided ample opportunity to ask questions regarding this consent and the Services and have had my questions answered to my satisfaction. I give my informed consent for the services to be provided through the use of telemedicine in my medical care

## 2023-04-15 NOTE — Telephone Encounter (Signed)
Name: Michael Pace  DOB: 11-27-1945  MRN: [id number]  Primary Cardiologist: Rollene Rotunda, MD  Preoperative team, please contact this patient and set up a phone call appointment for further preoperative risk assessment. Please obtain consent and complete medication review. Thank you for your help.  Re: ASA - do not see absolute indication based on hx that would preclude holding if needed; can be finalized at time of tele visit.   Laurann Montana, PA-C 04/15/2023, 11:53 AM [facility]

## 2023-04-15 NOTE — Telephone Encounter (Signed)
Pt is scheduled for tele preop 10/15 at 9:40am. Pt states surgery will be end of Oct early nov. Med rec and consent done

## 2023-05-06 NOTE — Progress Notes (Unsigned)
Virtual Visit via Telephone Note   Because of Michael Pace's co-morbid illnesses, he is at least at moderate risk for complications without adequate follow up.  This format is felt to be most appropriate for this patient at this time.  The patient did not have access to video technology/had technical difficulties with video requiring transitioning to audio format only (telephone).  All issues noted in this document were discussed and addressed.  No physical exam could be performed with this format.  Please refer to the patient's chart for his consent to telehealth for [facility].  Evaluation Performed:  Preoperative cardiovascular risk assessment _____________   Date:  05/06/2023   Patient ID:  Michael Pace September 05, 1945, MRN [id number] Patient Location:  Home Provider location:   Office  Primary Care Provider:  Theodis Shove, DO Primary Cardiologist:  Rollene Rotunda, MD  Chief Complaint / Patient Profile   77 y.o. y/o male with a h/o bradycardia, mild to moderate MR and mild AI, ascending aortic dilatation, SVT, who is pending left reverse total shoulder arthroplasty with Dr. Ave Filter, date TBD and presents today for telephonic preoperative cardiovascular risk assessment.  History of Present Illness    Michael Pace is a 77 y.o. male who presents via audio/video conferencing for a telehealth visit today.  Pt was last seen in cardiology clinic on 08/16/2022 by Dr. Antoine Poche.  At that time Damyn Weitzel was doing well.  The patient is now pending procedure as outlined above. Since his last visit, he  denies chest pain, lower extremity edema, fatigue, palpitations, melena, hematuria, hemoptysis, diaphoresis, weakness, presyncope, syncope, orthopnea, and PND. He reports history of shortness of breath since he was a child. He is able to achieve > 4 METS activity without concerning cardiac symptoms.    Past Medical History    Past Medical History:   Diagnosis Date   Arthritis    BPH (benign prostatic hyperplasia)    Bradycardia    Cataracts, bilateral    Dilated aortic root (HCC)    Dyspnea    Family history of adverse reaction to anesthesia    Dad slow to wake up   GERD (gastroesophageal reflux disease)    Headache    Melanoma (HCC) 1988   LEFT ABDOMEN [place]- [place]   Nodular infiltrative basal cell carcinoma (BCC) 05/10/2021   Left Forearm - posterior   Nonrheumatic mitral valve regurgitation    Sleep apnea    Squamous cell carcinoma of skin 03/29/2016   RIGHT SIDEBURN TX= CX3 5FU   Past Surgical History:  Procedure Laterality Date   REVERSE SHOULDER ARTHROPLASTY Right 09/13/2022   Procedure: REVERSE SHOULDER ARTHROPLASTY;  Surgeon: Jones Broom, MD;  Location: [facility];  Service: Orthopedics;  Laterality: Right;   Scrotal surgery     SHOULDER ARTHROSCOPY Right    SKIN CANCER EXCISION     Melanoma   TOTAL KNEE ARTHROPLASTY     bilateral    Allergies  Allergies  Allergen Reactions   Codeine Hives, Itching and Swelling    headache   Penicillins Hives, Itching and Swelling    headache    Home Medications    Prior to Admission medications   Medication Sig Start Date End Date Taking? Authorizing Provider  amLODipine (NORVASC) 5 MG tablet Take 5 mg by mouth daily. 10/06/19   [provider]  aspirin EC 81 MG tablet Take 81 mg by mouth daily. Swallow whole.    [provider]  azelastine (ASTELIN) 0.1 % nasal spray  Place 1 spray into both nostrils daily as needed for rhinitis or allergies. Use in each nostril as directed    [provider]  benazepril (LOTENSIN) 20 MG tablet Take 20 mg by mouth daily. 10/06/19   [provider]  carvedilol (COREG) 12.5 MG tablet Take 12.5 mg by mouth 2 (two) times daily. 11/19/19   [provider]  hydroxypropyl methylcellulose / hypromellose (ISOPTO TEARS / GONIOVISC) 2.5 % ophthalmic solution Place 1 drop into both eyes as needed for dry  eyes.    [provider]  Multiple Vitamin (MULTIVITAMIN WITH MINERALS) TABS tablet Take 1 tablet by mouth daily.    [provider]  multivitamin-lutein (OCUVITE-LUTEIN) CAPS capsule Take 1 capsule by mouth daily.    [provider]  psyllium (REGULOID) 0.52 g capsule Take 0.52 g by mouth daily.    [provider]  rosuvastatin (CRESTOR) 5 MG tablet Take 5 mg by mouth daily. 08/16/22   [provider]    Physical Exam    Vital Signs:  Michael Pace does not have vital signs available for review today.  Given telephonic nature of communication, physical exam is limited. AAOx3. NAD. Normal affect.  Speech and respirations are unlabored.  Accessory Clinical Findings    None  Assessment & Plan    1.  Preoperative Cardiovascular Risk Assessment: According to the Revised Cardiac Risk Index (RCRI), his Perioperative Risk of Major Cardiac Event is (%): 0.4. His Functional Capacity in METs is: 6.61 according to the Duke Activity Status Index (DASI). The patient is doing well from a cardiac perspective. Therefore, based on ACC/AHA guidelines, the patient would be at acceptable risk for the planned procedure without further cardiovascular testing. I will forward clearance to requesting provider.   The patient was advised that if he develops new symptoms prior to surgery to contact our office to arrange for a follow-up visit, and he verbalized understanding.  Per office protocol, he may hold aspirin for 5-7 days prior to procedure and should resume as soon as hemodynamically stable postoperatively.   A copy of this note will be routed to requesting surgeon.  Time:   Today, I have spent 10 minutes with the patient with telehealth technology discussing medical history, symptoms, and management plan.    Levi Aland, NP-C  05/07/2023, 9:39 AM [address], [address] Office [phone] Fax [phone]

## 2023-05-07 ENCOUNTER — Ambulatory Visit: Payer: Medicare Other | Attending: Nurse Practitioner | Admitting: Nurse Practitioner

## 2023-05-07 ENCOUNTER — Encounter: Payer: Self-pay | Admitting: Nurse Practitioner

## 2023-05-07 DIAGNOSIS — Z01810 Encounter for preprocedural cardiovascular examination: Secondary | ICD-10-CM

## 2023-06-03 ENCOUNTER — Other Ambulatory Visit: Payer: Self-pay | Admitting: Orthopedic Surgery

## 2023-06-06 NOTE — Patient Instructions (Signed)
DUE TO COVID-19 ONLY TWO VISITORS  (aged 79 and older)  ARE ALLOWED TO COME WITH YOU AND STAY IN THE WAITING ROOM ONLY DURING PRE OP AND PROCEDURE.   **NO VISITORS ARE ALLOWED IN THE SHORT STAY AREA OR RECOVERY ROOM!!**  IF YOU WILL BE ADMITTED INTO THE HOSPITAL YOU ARE ALLOWED ONLY FOUR SUPPORT PEOPLE DURING VISITATION HOURS ONLY (7 AM -8PM)   The support person(s) must pass our screening, gel in and out, and wear a mask at all times, including in the patient's room. Patients must also wear a mask when staff or their support person are in the room. Visitors GUEST BADGE MUST BE WORN VISIBLY  One adult visitor may remain with you overnight and MUST be in the room by 8 P.M.     Your procedure is scheduled on: 06/13/23   Report to [facility] Main Entrance    Report to admitting at : 5:15 AM   Call this number if you have problems the morning of surgery [phone]   Do not eat food :After Midnight.   After Midnight you may have the following liquids until: 4:30 AM DAY OF SURGERY  Water Black Coffee (sugar ok, NO MILK/CREAM OR CREAMERS)  Tea (sugar ok, NO MILK/CREAM OR CREAMERS) regular and decaf                             Plain Jell-O (NO RED)                                           Fruit ices (not with fruit pulp, NO RED)                                     Popsicles (NO RED)                                                                  Juice: apple, WHITE grape, WHITE cranberry Sports drinks like Gatorade (NO RED)   The day of surgery:  Drink ONE (1) Pre-Surgery Clear Ensure at : 4:30 AM the morning of surgery. Drink in one sitting. Do not sip.  This drink was given to you during your hospital  pre-op appointment visit. Nothing else to drink after completing the  Pre-Surgery Clear Ensure or G2.          If you have questions, please contact your surgeon's office.  FOLLOW ANY ADDITIONAL PRE OP INSTRUCTIONS YOU RECEIVED FROM YOUR SURGEON'S OFFICE!!!   Oral  Hygiene is also important to reduce your risk of infection.                                    Remember - BRUSH YOUR TEETH THE MORNING OF SURGERY WITH YOUR REGULAR TOOTHPASTE  DENTURES WILL BE REMOVED PRIOR TO SURGERY PLEASE DO NOT APPLY "Poly grip" OR ADHESIVES!!!   Do NOT smoke after Midnight   Take these medicines the morning of surgery with  A SIP OF WATER: carvedilol,amlodipine.Fexofenadine as needed.                     You may not have any metal on your body including hair pins, jewelry, and body piercing             Do not wear lotions, powders, perfumes/cologne, or deodorant              Men may shave face and neck.   Do not bring valuables to the hospital. [facility] IS NOT             RESPONSIBLE   FOR VALUABLES.   Contacts, glasses, or bridgework may not be worn into surgery.   Bring small overnight bag day of surgery.   DO NOT BRING YOUR HOME MEDICATIONS TO THE HOSPITAL. PHARMACY WILL DISPENSE MEDICATIONS LISTED ON YOUR MEDICATION LIST TO YOU DURING YOUR ADMISSION IN THE HOSPITAL!    Patients discharged on the day of surgery will not be allowed to drive home.  Someone NEEDS to stay with you for the first 24 hours after anesthesia.   Special Instructions: Bring a copy of your healthcare power of attorney and living will documents         the day of surgery if you haven't scanned them before.              Please read over the following fact sheets you were given: IF YOU HAVE QUESTIONS ABOUT YOUR PRE-OP INSTRUCTIONS PLEASE CALL [phone]    [facility]- Preparing for Total Shoulder Arthroplasty    Before surgery, you can play an important role. Because skin is not sterile, your skin needs to be as free of germs as possible. You can reduce the number of germs on your skin by using the following products. Benzoyl Peroxide Gel Reduces the number of germs present on the skin Applied twice a day to shoulder area starting two days before surgery     ==================================================================  Please follow these instructions carefully:  BENZOYL PEROXIDE 5% GEL  Please do not use if you have an allergy to benzoyl peroxide.   If your skin becomes reddened/irritated stop using the benzoyl peroxide.  Starting two days before surgery, apply as follows: Apply benzoyl peroxide in the morning and at night. Apply after taking a shower. If you are not taking a shower clean entire shoulder front, back, and side along with the armpit with a clean wet washcloth.  Place a quarter-sized dollop on your shoulder and rub in thoroughly, making sure to cover the front, back, and side of your shoulder, along with the armpit.   2 days before ____ AM   ____ PM              1 day before ____ AM   ____ PM                         Do this twice a day for two days.  (Last application is the night before surgery, AFTER using the CHG soap as described below).  Do NOT apply benzoyl peroxide gel on the day of surgery.  Pre-operative 5 CHG Bath Instructions   You can play a key role in reducing the risk of infection after surgery. Your skin needs to be as free of germs as possible. You can reduce the number of germs on your skin by washing with CHG (chlorhexidine gluconate) soap before surgery. CHG is an  antiseptic soap that kills germs and continues to kill germs even after washing.   DO NOT use if you have an allergy to chlorhexidine/CHG or antibacterial soaps. If your skin becomes reddened or irritated, stop using the CHG and notify one of our RNs at : [phone] .   Please shower with the CHG soap starting 4 days before surgery using the following schedule:     Please keep in mind the following:  DO NOT shave, including legs and underarms, starting the day of your first shower.   You may shave your face at any point before/day of surgery.  Place clean sheets on your bed the day you start using CHG soap. Use a clean washcloth  (not used since being washed) for each shower. DO NOT sleep with pets once you start using the CHG.   CHG Shower Instructions:  If you choose to wash your hair and private area, wash first with your normal shampoo/soap.  After you use shampoo/soap, rinse your hair and body thoroughly to remove shampoo/soap residue.  Turn the water OFF and apply about 3 tablespoons (45 ml) of CHG soap to a CLEAN washcloth.  Apply CHG soap ONLY FROM YOUR NECK DOWN TO YOUR TOES (washing for 3-5 minutes)  DO NOT use CHG soap on face, private areas, open wounds, or sores.  Pay special attention to the area where your surgery is being performed.  If you are having back surgery, having someone wash your back for you may be helpful. Wait 2 minutes after CHG soap is applied, then you may rinse off the CHG soap.  Pat dry with a clean towel  Put on clean clothes/pajamas   If you choose to wear lotion, please use ONLY the CHG-compatible lotions on the back of this paper.     Additional instructions for the day of surgery: DO NOT APPLY any lotions, deodorants, cologne, or perfumes.   Put on clean/comfortable clothes.  Brush your teeth.  Ask your nurse before applying any prescription medications to the skin.   CHG Compatible Lotions   Aveeno Moisturizing lotion  Cetaphil Moisturizing Cream  Cetaphil Moisturizing Lotion  Clairol Herbal Essence Moisturizing Lotion, Dry Skin  Clairol Herbal Essence Moisturizing Lotion, Extra Dry Skin  Clairol Herbal Essence Moisturizing Lotion, Normal Skin  Curel Age Defying Therapeutic Moisturizing Lotion with Alpha Hydroxy  Curel Extreme Care Body Lotion  Curel Soothing Hands Moisturizing Hand Lotion  Curel Therapeutic Moisturizing Cream, Fragrance-Free  Curel Therapeutic Moisturizing Lotion, Fragrance-Free  Curel Therapeutic Moisturizing Lotion, Original Formula  Eucerin Daily Replenishing Lotion  Eucerin Dry Skin Therapy Plus Alpha Hydroxy Crme  Eucerin Dry Skin Therapy  Plus Alpha Hydroxy Lotion  Eucerin Original Crme  Eucerin Original Lotion  Eucerin Plus Crme Eucerin Plus Lotion  Eucerin TriLipid Replenishing Lotion  Keri Anti-Bacterial Hand Lotion  Keri Deep Conditioning Original Lotion Dry Skin Formula Softly Scented  Keri Deep Conditioning Original Lotion, Fragrance Free Sensitive Skin Formula  Keri Lotion Fast Absorbing Fragrance Free Sensitive Skin Formula  Keri Lotion Fast Absorbing Softly Scented Dry Skin Formula  Keri Original Lotion  Keri Skin Renewal Lotion Keri Silky Smooth Lotion  Keri Silky Smooth Sensitive Skin Lotion  Nivea Body Creamy Conditioning Oil  Nivea Body Extra Enriched Teacher, adult education Moisturizing Lotion Nivea Crme  Nivea Skin Firming Lotion  NutraDerm 30 Skin Lotion  NutraDerm Skin Lotion  NutraDerm Therapeutic Skin Cream  NutraDerm Therapeutic Skin Lotion  ProShield Protective Hand Cream  Provon moisturizing lotion   Incentive Spirometer  An incentive spirometer is a tool that can help keep your lungs clear and active. This tool measures how well you are filling your lungs with each breath. Taking long deep breaths may help reverse or decrease the chance of developing breathing (pulmonary) problems (especially infection) following: A long period of time when you are unable to move or be active. BEFORE THE PROCEDURE  If the spirometer includes an indicator to show your best effort, your nurse or respiratory therapist will set it to a desired goal. If possible, sit up straight or lean slightly forward. Try not to slouch. Hold the incentive spirometer in an upright position. INSTRUCTIONS FOR USE  Sit on the edge of your bed if possible, or sit up as far as you can in bed or on a chair. Hold the incentive spirometer in an upright position. Breathe out normally. Place the mouthpiece in your mouth and seal your lips tightly around it. Breathe in slowly and as deeply as possible,  raising the piston or the ball toward the top of the column. Hold your breath for 3-5 seconds or for as long as possible. Allow the piston or ball to fall to the bottom of the column. Remove the mouthpiece from your mouth and breathe out normally. Rest for a few seconds and repeat Steps 1 through 7 at least 10 times every 1-2 hours when you are awake. Take your time and take a few normal breaths between deep breaths. The spirometer may include an indicator to show your best effort. Use the indicator as a goal to work toward during each repetition. After each set of 10 deep breaths, practice coughing to be sure your lungs are clear. If you have an incision (the cut made at the time of surgery), support your incision when coughing by placing a pillow or rolled up towels firmly against it. Once you are able to get out of bed, walk around indoors and cough well. You may stop using the incentive spirometer when instructed by your caregiver.  RISKS AND COMPLICATIONS Take your time so you do not get dizzy or light-headed. If you are in pain, you may need to take or ask for pain medication before doing incentive spirometry. It is harder to take a deep breath if you are having pain. AFTER USE Rest and breathe slowly and easily. It can be helpful to keep track of a log of your progress. Your caregiver can provide you with a simple table to help with this. If you are using the spirometer at home, follow these instructions: SEEK MEDICAL CARE IF:  You are having difficultly using the spirometer. You have trouble using the spirometer as often as instructed. Your pain medication is not giving enough relief while using the spirometer. You develop fever of 100.5 F (38.1 C) or higher. SEEK IMMEDIATE MEDICAL CARE IF:  You cough up bloody sputum that had not been present before. You develop fever of 102 F (38.9 C) or greater. You develop worsening pain at or near the incision site. MAKE SURE YOU:  Understand  these instructions. Will watch your condition. Will get help right away if you are not doing well or get worse. Document Released: 11/19/2006 Document Revised: 10/01/2011 Document Reviewed: 01/20/2007 [facility] Patient Information 2014 [place], [place].   ________________________________________________________________________

## 2023-06-07 ENCOUNTER — Other Ambulatory Visit: Payer: Self-pay

## 2023-06-07 ENCOUNTER — Encounter (HOSPITAL_COMMUNITY): Payer: Self-pay

## 2023-06-07 ENCOUNTER — Encounter (HOSPITAL_COMMUNITY)
Admission: RE | Admit: 2023-06-07 | Discharge: 2023-06-07 | Disposition: A | Discharge status: Home / Self Care | Payer: Medicare Other | Source: Ambulatory Visit | Attending: Orthopedic Surgery

## 2023-06-07 ENCOUNTER — Ambulatory Visit (HOSPITAL_COMMUNITY)
Admission: RE | Admit: 2023-06-07 | Discharge: 2023-06-07 | Disposition: A | Discharge status: Home / Self Care | Payer: Medicare Other | Source: Ambulatory Visit | Attending: Orthopedic Surgery | Admitting: Orthopedic Surgery

## 2023-06-07 VITALS — BP 119/80 | HR 59 | Temp 97.8°F | Ht 70.0 in | Wt 244.0 lb

## 2023-06-07 DIAGNOSIS — Z01818 Encounter for other preprocedural examination: Secondary | ICD-10-CM | POA: Insufficient documentation

## 2023-06-07 DIAGNOSIS — R001 Bradycardia, unspecified: Secondary | ICD-10-CM | POA: Insufficient documentation

## 2023-06-07 HISTORY — DX: Cardiac arrhythmia, unspecified: I49.9

## 2023-06-07 HISTORY — DX: Essential (primary) hypertension: I10

## 2023-06-07 LAB — SURGICAL PCR SCREEN
MRSA, PCR: NEGATIVE
Staphylococcus aureus: POSITIVE — AB

## 2023-06-07 LAB — CBC
HCT: 47 % (ref 39.0–52.0)
Hemoglobin: 15 g/dL (ref 13.0–17.0)
MCH: 30.3 pg (ref 26.0–34.0)
MCHC: 31.9 g/dL (ref 30.0–36.0)
MCV: 94.9 fL (ref 80.0–100.0)
Platelets: 168 10*3/uL (ref 150–400)
RBC: 4.95 MIL/uL (ref 4.22–5.81)
RDW: 13.1 % (ref 11.5–15.5)
WBC: 7.3 10*3/uL (ref 4.0–10.5)
nRBC: 0 % (ref 0.0–0.2)

## 2023-06-07 LAB — BASIC METABOLIC PANEL
Anion gap: 8 (ref 5–15)
BUN: 16 mg/dL (ref 8–23)
CO2: 24 mmol/L (ref 22–32)
Calcium: 9.2 mg/dL (ref 8.9–10.3)
Chloride: 106 mmol/L (ref 98–111)
Creatinine, Ser: 0.75 mg/dL (ref 0.61–1.24)
GFR, Estimated: >60 mL/min (ref >60)
Glucose, Bld: 107 mg/dL — ABNORMAL HIGH (ref 70–99)
Potassium: 4.9 mmol/L (ref 3.5–5.1)
Sodium: 138 mmol/L (ref 135–145)

## 2023-06-07 NOTE — Progress Notes (Signed)
PCR: + STAPH °

## 2023-06-07 NOTE — Progress Notes (Addendum)
For Anesthesia: PCP - Combs, Prince Solian, DO  Cardiologist - Rollene Rotunda, MD  Clearance: Zachary George Swinger: NP-C: 05/07/23 Bowel Prep reminder:  Chest x-ray -  EKG - 08/16/22 Stress Test -  ECHO - 01/07/23 Cardiac Cath -  Pacemaker/ICD device last checked: Pacemaker orders received: Device Rep notified:  Spinal Cord Stimulator:  Sleep Study - Yes CPAP - NO  Fasting Blood Sugar - N/A Checks Blood Sugar _____ times a day Date and result of last Hgb A1c-  Last dose of GLP1 agonist- N/A GLP1 instructions:   Last dose of SGLT-2 inhibitors- N/A SGLT-2 instructions:   Blood Thinner Instructions:  Aspirin Instructions: On hold since 06/05/23 Last Dose:  Activity level: Can go up a flight of stairs and activities of daily living without stopping and without chest pain and/or shortness of breath   Able to exercise without chest pain and/or shortness of breath  Anesthesia review: Hx: HTN,Bradycardia,OSA(NO CPAP),SOB  Patient denies shortness of breath, fever, cough and chest pain at PAT appointment   Patient verbalized understanding of instructions that were given to them at the PAT appointment. Patient was also instructed that they will need to review over the PAT instructions again at home before surgery.

## 2023-06-10 ENCOUNTER — Encounter (HOSPITAL_COMMUNITY): Payer: Self-pay

## 2023-06-10 NOTE — Progress Notes (Addendum)
DISCUSSION: Michael Pace is a 77 yo male who presents to PAT prior to left reverse shoulder arthroplasty on 06/13/23 with Dr. Ave Filter. PMH of HTN, mod MR, mild AI, bradycardia, TAA (by CT in 07/2022), moderate OSA (does not use CPAP), GERD, BPH.  Pt had right shoulder arthroplasty on 09/13/22 without complications.  Pt follows with Cardiology for hx of bradycardia and SOB. Has known moderate MR by last echo in June 2024. Last seen in clinic on 08/16/22 with Dr. Antoine Poche. Had cardiac clearance visit on 05/07/23 and was cleared for surgery:  "Preoperative Cardiovascular Risk Assessment: According to the Revised Cardiac Risk Index (RCRI), his Perioperative Risk of Major Cardiac Event is (%): 0.4. His Functional Capacity in METs is: 6.61 according to the Duke Activity Status Index (DASI). The patient is doing well from a cardiac perspective. Therefore, based on ACC/AHA guidelines, the patient would be at acceptable risk for the planned procedure without further cardiovascular testing. I will forward clearance to requesting provider.    Per office protocol, he may hold aspirin for 5-7 days prior to procedure and should resume as soon as hemodynamically stable postoperatively."    VS:     06/07/2023   10:07 AM 09/13/2022    2:15 PM 09/13/2022    2:00 PM  Vitals with BMI  Height 5\' 10"     Weight 244 lbs    BMI 35.01    Systolic 119 120 540  Diastolic 80 74 72  Pulse 59 63 57     PROVIDERS: Combs, Prince Solian, DO Cardiologist - Rollene Rotunda, MD  LABS: Labs reviewed: Acceptable for surgery. (all labs ordered are listed, but only abnormal results are displayed)  Labs Reviewed - No data to display   IMAGES:  CXR 06/07/23:  FINDINGS: Heart size upper normal, stable.The cardiomediastinal contours are unchanged. The lungs are clear. Pulmonary vasculature is normal. No consolidation, pleural effusion, or pneumothorax. Previous reverse right shoulder arthroplasty. No acute osseous  abnormalities are seen.   IMPRESSION: No acute findings.  CTA Chest 08/03/22:  IMPRESSION: 1. Stable 4 cm thoracic aortic aneurysm. Recommend annual imaging followup by CTA or MRA. This recommendation follows 2010 ACCF/AHA/AATS/ACR/ASA/SCA/SCAI/SIR/STS/SVM Guidelines for the Diagnosis and Management of Patients with Thoracic Aortic Disease. Circulation. 2010; 121: J811-B147. Aortic aneurysm NOS (ICD10-I71.9) 2. Cholelithiasis without cholecystitis. 3.  Aortic Atherosclerosis (ICD10-I70.0).   EKG:   CV:  Echo 01/07/23:  IMPRESSIONS    1. Left ventricular ejection fraction, by estimation, is 65 to 70%. Left ventricular ejection fraction by 3D volume is 67 %. The left ventricle has normal function. The left ventricle has no regional wall motion abnormalities. There is mild asymmetric left ventricular hypertrophy of the basal-septal segment. Left ventricular diastolic parameters are consistent with Grade I diastolic dysfunction (impaired relaxation).  2. Right ventricular systolic function is normal. The right ventricular size is normal.  3. The mitral valve is abnormal. There is prolapse of the posterior mitral valve leaflet with a highly eccentric, anteriorly directed jet with splay artifact. Suspect mitral regurgitation is moderate but it is difficult to quantify due to eccentricity of the jet. E wave is <1.2, which suggests MR is not severe.  4. The aortic valve is tricuspid. Aortic valve regurgitation is mild. Aortic valve sclerosis is present, with no evidence of aortic valve stenosis.  5. Aortic dilatation noted. There is mild dilatation of the aortic root, measuring 41 mm. There is mild dilatation of the ascending aorta, measuring 43 mm.  Comparison(s): Compared to prior TTE in 2022, suspect  the MR is now within the moderate range. Otherwise, there is no significant change.  Past Medical History:  Diagnosis Date   Arthritis    BPH (benign prostatic  hyperplasia)    Bradycardia    Cataracts, bilateral    Dilated aortic root (HCC)    Dyspnea    Dysrhythmia    Family history of adverse reaction to anesthesia    Dad slow to wake up   GERD (gastroesophageal reflux disease)    Headache    Hypertension    Melanoma (HCC) 1988   LEFT ABDOMEN [place]- [place]   Nodular infiltrative basal cell carcinoma (BCC) 05/10/2021   Left Forearm - posterior   Nonrheumatic mitral valve regurgitation    Sleep apnea    Squamous cell carcinoma of skin 03/29/2016   RIGHT SIDEBURN TX= CX3 5FU    Past Surgical History:  Procedure Laterality Date   CATARACT EXTRACTION, BILATERAL     REVERSE SHOULDER ARTHROPLASTY Right 09/13/2022   Procedure: REVERSE SHOULDER ARTHROPLASTY;  Surgeon: Jones Broom, MD;  Location: [facility];  Service: Orthopedics;  Laterality: Right;   Scrotal surgery     SHOULDER ARTHROSCOPY Right    SKIN CANCER EXCISION     Melanoma   TONSILLECTOMY     TOTAL KNEE ARTHROPLASTY     bilateral    MEDICATIONS:  amLODipine (NORVASC) 5 MG tablet   aspirin EC 81 MG tablet   azelastine (ASTELIN) 0.1 % nasal spray   benazepril (LOTENSIN) 20 MG tablet   carvedilol (COREG) 12.5 MG tablet   fexofenadine (ALLEGRA) 180 MG tablet   ibuprofen (ADVIL) 200 MG tablet   LUTEIN PO   Multiple Vitamin (MULTIVITAMIN WITH MINERALS) TABS tablet   psyllium (REGULOID) 0.52 g capsule   rosuvastatin (CRESTOR) 5 MG tablet   No current facility-administered medications for this encounter.   Marcille Blanco [facility] Short Stay/Anesthesiology [facility] Phone [phone] 06/10/2023 9:17 AM

## 2023-06-10 NOTE — Anesthesia Preprocedure Evaluation (Signed)
Anesthesia Evaluation  Patient identified by MRN, date of birth, ID band Patient awake    Reviewed: Allergy & Precautions, NPO status , Patient's Chart, lab work & pertinent test results  Airway Mallampati: II  TM Distance: >3 FB Neck ROM: Full    Dental  (+) Teeth Intact, Dental Advisory Given   Pulmonary sleep apnea    breath sounds clear to auscultation       Cardiovascular hypertension, + dysrhythmias  Rhythm:Regular Rate:Normal     Neuro/Psych  Headaches  negative psych ROS   GI/Hepatic Neg liver ROS,GERD  ,,  Endo/Other  negative endocrine ROS    Renal/GU negative Renal ROS     Musculoskeletal  (+) Arthritis ,    Abdominal   Peds  Hematology negative hematology ROS (+)   Anesthesia Other Findings   Reproductive/Obstetrics                             Anesthesia Physical Anesthesia Plan  ASA: 2  Anesthesia Plan: General   Post-op Pain Management: Regional block*   Induction: Intravenous  PONV Risk Score and Plan: 3 and Ondansetron, Dexamethasone and Midazolam  Airway Management Planned: Oral ETT  Additional Equipment: None  Intra-op Plan:   Post-operative Plan: Extubation in OR  Informed Consent: I have reviewed the patients History and Physical, chart, labs and discussed the procedure including the risks, benefits and alternatives for the proposed anesthesia with the patient or authorized representative who has indicated his/her understanding and acceptance.     Dental advisory given  Plan Discussed with: CRNA  Anesthesia Plan Comments: (See PAT note from 11/15 by K Riyan Gavina PA-C )        Anesthesia Quick Evaluation

## 2023-06-13 ENCOUNTER — Ambulatory Visit (HOSPITAL_BASED_OUTPATIENT_CLINIC_OR_DEPARTMENT_OTHER): Payer: Medicare Other | Admitting: Certified Registered Nurse Anesthetist

## 2023-06-13 ENCOUNTER — Ambulatory Visit (HOSPITAL_COMMUNITY)
Admission: RE | Admit: 2023-06-13 | Discharge: 2023-06-13 | Disposition: A | Discharge status: Home / Self Care | Payer: Medicare Other | Attending: Orthopedic Surgery | Admitting: Orthopedic Surgery

## 2023-06-13 ENCOUNTER — Encounter (HOSPITAL_COMMUNITY)
Admission: RE | Disposition: A | Discharge status: Home / Self Care | Payer: Self-pay | Source: Home / Self Care | Attending: Orthopedic Surgery

## 2023-06-13 ENCOUNTER — Other Ambulatory Visit: Payer: Self-pay

## 2023-06-13 ENCOUNTER — Ambulatory Visit (HOSPITAL_COMMUNITY): Payer: Medicare Other | Admitting: Medical

## 2023-06-13 ENCOUNTER — Encounter (HOSPITAL_COMMUNITY): Payer: Self-pay | Admitting: Orthopedic Surgery

## 2023-06-13 DIAGNOSIS — Z79899 Other long term (current) drug therapy: Secondary | ICD-10-CM | POA: Diagnosis not present

## 2023-06-13 DIAGNOSIS — M75102 Unspecified rotator cuff tear or rupture of left shoulder, not specified as traumatic: Secondary | ICD-10-CM | POA: Insufficient documentation

## 2023-06-13 DIAGNOSIS — M19012 Primary osteoarthritis, left shoulder: Secondary | ICD-10-CM | POA: Insufficient documentation

## 2023-06-13 DIAGNOSIS — K219 Gastro-esophageal reflux disease without esophagitis: Secondary | ICD-10-CM | POA: Diagnosis not present

## 2023-06-13 DIAGNOSIS — M25712 Osteophyte, left shoulder: Secondary | ICD-10-CM | POA: Diagnosis not present

## 2023-06-13 DIAGNOSIS — R519 Headache, unspecified: Secondary | ICD-10-CM | POA: Diagnosis not present

## 2023-06-13 DIAGNOSIS — I10 Essential (primary) hypertension: Secondary | ICD-10-CM | POA: Diagnosis not present

## 2023-06-13 DIAGNOSIS — G4730 Sleep apnea, unspecified: Secondary | ICD-10-CM | POA: Insufficient documentation

## 2023-06-13 DIAGNOSIS — Z7982 Long term (current) use of aspirin: Secondary | ICD-10-CM | POA: Diagnosis not present

## 2023-06-13 HISTORY — PX: REVERSE SHOULDER ARTHROPLASTY: SHX5054

## 2023-06-13 SURGERY — ARTHROPLASTY, SHOULDER, TOTAL, REVERSE
Anesthesia: General | Site: Shoulder | Laterality: Left

## 2023-06-13 MED ORDER — ONDANSETRON HCL 4 MG/2ML IJ SOLN
2.0000 mg | Freq: Once | INTRAMUSCULAR | Status: AC
  Administered 2023-06-13: 2 mg via INTRAVENOUS

## 2023-06-13 MED ORDER — PROPOFOL 10 MG/ML IV BOLUS
INTRAVENOUS | Status: AC
  Filled 2023-06-13: qty 20

## 2023-06-13 MED ORDER — BUPIVACAINE LIPOSOME 1.3 % IJ SUSP
INTRAMUSCULAR | Status: DC | PRN
  Administered 2023-06-13: 10 mL via PERINEURAL

## 2023-06-13 MED ORDER — PROPOFOL 10 MG/ML IV BOLUS
INTRAVENOUS | Status: DC | PRN
  Administered 2023-06-13: 120 mg via INTRAVENOUS

## 2023-06-13 MED ORDER — ACETAMINOPHEN 10 MG/ML IV SOLN: 1000.0000 mg | Freq: Once | INTRAVENOUS | Status: DC | PRN

## 2023-06-13 MED ORDER — METHOCARBAMOL 1000 MG/10ML IJ SOLN: 500.0000 mg | Freq: Four times a day (QID) | INTRAMUSCULAR | Status: DC | PRN

## 2023-06-13 MED ORDER — ORAL CARE MOUTH RINSE: 15.0000 mL | Freq: Once | OROMUCOSAL | Status: AC

## 2023-06-13 MED ORDER — METHOCARBAMOL 500 MG PO TABS: 500.0000 mg | ORAL_TABLET | Freq: Four times a day (QID) | ORAL | Status: DC | PRN

## 2023-06-13 MED ORDER — SODIUM CHLORIDE 0.9 % IR SOLN
Status: DC | PRN
  Administered 2023-06-13: 1000 mL

## 2023-06-13 MED ORDER — ACETAMINOPHEN 160 MG/5ML PO SOLN: 325.0000 mg | Freq: Once | ORAL | Status: DC | PRN

## 2023-06-13 MED ORDER — FENTANYL CITRATE (PF) 100 MCG/2ML IJ SOLN
INTRAMUSCULAR | Status: DC | PRN
  Administered 2023-06-13: 75 ug via INTRAVENOUS
  Administered 2023-06-13: 25 ug via INTRAVENOUS

## 2023-06-13 MED ORDER — ROCURONIUM BROMIDE 10 MG/ML (PF) SYRINGE
PREFILLED_SYRINGE | INTRAVENOUS | Status: AC
  Filled 2023-06-13: qty 10

## 2023-06-13 MED ORDER — WATER FOR IRRIGATION, STERILE IR SOLN
Status: DC | PRN
  Administered 2023-06-13: 1000 mL

## 2023-06-13 MED ORDER — TRANEXAMIC ACID-NACL 1000-0.7 MG/100ML-% IV SOLN
1000.0000 mg | INTRAVENOUS | Status: AC
Start: 2023-06-13 — End: 2023-06-13
  Administered 2023-06-13: 1000 mg via INTRAVENOUS
  Filled 2023-06-13: qty 100

## 2023-06-13 MED ORDER — LACTATED RINGERS IV SOLN: INTRAVENOUS | Status: AC

## 2023-06-13 MED ORDER — MEPERIDINE HCL 50 MG/ML IJ SOLN: 6.2500 mg | INTRAMUSCULAR | Status: DC | PRN

## 2023-06-13 MED ORDER — DEXAMETHASONE SODIUM PHOSPHATE 10 MG/ML IJ SOLN
INTRAMUSCULAR | Status: DC | PRN
  Administered 2023-06-13: 4 mg via INTRAVENOUS

## 2023-06-13 MED ORDER — LIDOCAINE 2% (20 MG/ML) 5 ML SYRINGE
INTRAMUSCULAR | Status: DC | PRN
  Administered 2023-06-13: 40 mg via INTRAVENOUS

## 2023-06-13 MED ORDER — PROPOFOL 10 MG/ML IV BOLUS
INTRAVENOUS | Status: AC
Start: 2023-06-13 — End: ?
  Filled 2023-06-13: qty 20

## 2023-06-13 MED ORDER — DEXAMETHASONE SODIUM PHOSPHATE 10 MG/ML IJ SOLN
INTRAMUSCULAR | Status: AC
  Filled 2023-06-13: qty 1

## 2023-06-13 MED ORDER — ROCURONIUM BROMIDE 10 MG/ML (PF) SYRINGE
PREFILLED_SYRINGE | INTRAVENOUS | Status: DC | PRN
  Administered 2023-06-13: 60 mg via INTRAVENOUS
  Administered 2023-06-13: 20 mg via INTRAVENOUS

## 2023-06-13 MED ORDER — DROPERIDOL 2.5 MG/ML IJ SOLN
0.6250 mg | Freq: Once | INTRAMUSCULAR | Status: AC | PRN
  Administered 2023-06-13: 0.625 mg via INTRAVENOUS

## 2023-06-13 MED ORDER — EPHEDRINE SULFATE-NACL 50-0.9 MG/10ML-% IV SOSY
PREFILLED_SYRINGE | INTRAVENOUS | Status: DC | PRN
  Administered 2023-06-13 (×3): 5 mg via INTRAVENOUS

## 2023-06-13 MED ORDER — CEFAZOLIN SODIUM-DEXTROSE 2-4 GM/100ML-% IV SOLN
2.0000 g | INTRAVENOUS | Status: AC
  Administered 2023-06-13: 2 g via INTRAVENOUS
  Filled 2023-06-13: qty 100

## 2023-06-13 MED ORDER — LACTATED RINGERS IV SOLN: INTRAVENOUS | Status: DC

## 2023-06-13 MED ORDER — DROPERIDOL 2.5 MG/ML IJ SOLN
INTRAMUSCULAR | Status: AC
  Filled 2023-06-13: qty 2

## 2023-06-13 MED ORDER — ONDANSETRON HCL 4 MG/2ML IJ SOLN
INTRAMUSCULAR | Status: AC
  Filled 2023-06-13: qty 2

## 2023-06-13 MED ORDER — HYDROMORPHONE HCL 1 MG/ML IJ SOLN: 0.2500 mg | INTRAMUSCULAR | Status: DC | PRN

## 2023-06-13 MED ORDER — FENTANYL CITRATE (PF) 100 MCG/2ML IJ SOLN
INTRAMUSCULAR | Status: AC
  Filled 2023-06-13: qty 2

## 2023-06-13 MED ORDER — TIZANIDINE HCL 4 MG PO TABS: 4.0000 mg | ORAL_TABLET | Freq: Three times a day (TID) | ORAL | 0 refills | Status: DC | PRN

## 2023-06-13 MED ORDER — EPHEDRINE 5 MG/ML INJ
INTRAVENOUS | Status: AC
  Filled 2023-06-13: qty 5

## 2023-06-13 MED ORDER — SUGAMMADEX SODIUM 200 MG/2ML IV SOLN
INTRAVENOUS | Status: DC | PRN
  Administered 2023-06-13: 225 mg via INTRAVENOUS

## 2023-06-13 MED ORDER — CHLORHEXIDINE GLUCONATE 0.12 % MT SOLN
15.0000 mL | Freq: Once | OROMUCOSAL | Status: AC
  Administered 2023-06-13: 15 mL via OROMUCOSAL

## 2023-06-13 MED ORDER — LIDOCAINE HCL (PF) 2 % IJ SOLN
INTRAMUSCULAR | Status: AC
Start: 2023-06-13 — End: ?
  Filled 2023-06-13: qty 5

## 2023-06-13 MED ORDER — BUPIVACAINE HCL (PF) 0.5 % IJ SOLN
INTRAMUSCULAR | Status: DC | PRN
  Administered 2023-06-13: 10 mL via PERINEURAL

## 2023-06-13 MED ORDER — OXYCODONE HCL 5 MG PO TABS: 5.0000 mg | ORAL_TABLET | ORAL | 0 refills | Status: DC | PRN

## 2023-06-13 MED ORDER — ACETAMINOPHEN 325 MG PO TABS: 325.0000 mg | ORAL_TABLET | Freq: Once | ORAL | Status: DC | PRN

## 2023-06-13 SURGICAL SUPPLY — 72 items
BAG COUNTER SPONGE SURGICOUNT (BAG) IMPLANT
BAG ZIPLOCK 12X15 (MISCELLANEOUS) ×1 IMPLANT
BASEPLATE P2 COATD GLND 6.5X30 (Shoulder) IMPLANT
BIT DRILL 1.6MX128 (BIT) IMPLANT
BIT DRILL 2.5 DIA 127 CALI (BIT) IMPLANT
BIT DRILL 4 DIA CALIBRATED (BIT) IMPLANT
BLADE SAW SGTL 73X25 THK (BLADE) ×1 IMPLANT
BOOTIES KNEE HIGH SLOAN (MISCELLANEOUS) ×2 IMPLANT
COOLER ICEMAN CLASSIC (MISCELLANEOUS) IMPLANT
COVER BACK TABLE 60X90IN (DRAPES) ×1 IMPLANT
COVER SURGICAL LIGHT HANDLE (MISCELLANEOUS) ×1 IMPLANT
DRAPE INCISE IOBAN 66X45 STRL (DRAPES) ×1 IMPLANT
DRAPE POUCH INSTRU U-SHP 10X18 (DRAPES) ×1 IMPLANT
DRAPE SHEET LG 3/4 BI-LAMINATE (DRAPES) ×1 IMPLANT
DRAPE SURG 17X11 SM STRL (DRAPES) ×1 IMPLANT
DRAPE SURG ORHT 6 SPLT 77X108 (DRAPES) ×2 IMPLANT
DRAPE TOP 10253 STERILE (DRAPES) ×1 IMPLANT
DRAPE U-SHAPE 47X51 STRL (DRAPES) ×1 IMPLANT
DRSG AQUACEL AG ADV 3.5X 6 (GAUZE/BANDAGES/DRESSINGS) ×1 IMPLANT
DURAPREP 26ML APPLICATOR (WOUND CARE) ×2 IMPLANT
ELECT BLADE TIP CTD 4 INCH (ELECTRODE) ×1 IMPLANT
ELECT REM PT RETURN 15FT ADLT (MISCELLANEOUS) ×1 IMPLANT
FACESHIELD WRAPAROUND (MASK) ×1 IMPLANT
FACESHIELD WRAPAROUND OR TEAM (MASK) ×1 IMPLANT
GLOVE BIO SURGEON STRL SZ7.5 (GLOVE) ×1 IMPLANT
GLOVE BIOGEL PI IND STRL 6.5 (GLOVE) ×1 IMPLANT
GLOVE BIOGEL PI IND STRL 8 (GLOVE) ×1 IMPLANT
GLOVE SURG SS PI 6.5 STRL IVOR (GLOVE) ×1 IMPLANT
GOWN STRL REUS W/ TWL LRG LVL3 (GOWN DISPOSABLE) ×1 IMPLANT
GOWN STRL REUS W/ TWL XL LVL3 (GOWN DISPOSABLE) ×1 IMPLANT
HEAD GLENOID W/SCREW 32MM (Shoulder) IMPLANT
HOOD PEEL AWAY T7 (MISCELLANEOUS) ×3 IMPLANT
INSERT EPOLY STND HUMERUS 36MM (Shoulder) ×1 IMPLANT
INSERT EPOLYSTD HUMERUS 36MM (Shoulder) IMPLANT
KIT BASIN OR (CUSTOM PROCEDURE TRAY) ×1 IMPLANT
KIT TURNOVER KIT A (KITS) IMPLANT
MANIFOLD NEPTUNE II (INSTRUMENTS) ×1 IMPLANT
NDL TROCAR POINT SZ 2 1/2 (NEEDLE) IMPLANT
NEEDLE TROCAR POINT SZ 2 1/2 (NEEDLE) ×1 IMPLANT
NS IRRIG 1000ML POUR BTL (IV SOLUTION) ×1 IMPLANT
P2 COATDE GLNOID BSEPLT 6.5X30 (Shoulder) ×1 IMPLANT
PACK SHOULDER (CUSTOM PROCEDURE TRAY) ×1 IMPLANT
PAD COLD SHLDR WRAP-ON (PAD) IMPLANT
PROTECTOR NERVE ULNAR (MISCELLANEOUS) IMPLANT
RESTRAINT HEAD UNIVERSAL NS (MISCELLANEOUS) ×1 IMPLANT
RETRIEVER SUT HEWSON (MISCELLANEOUS) IMPLANT
SCREW BONE LOCKING RSP 5.0X14 (Screw) ×1 IMPLANT
SCREW BONE LOCKING RSP 5.0X30 (Screw) ×1 IMPLANT
SCREW BONE LOCKING RSP 5.0X34 (Screw) ×1 IMPLANT
SCREW BONE RSP LOCK 5X14 (Screw) IMPLANT
SCREW BONE RSP LOCK 5X18 (Screw) IMPLANT
SCREW BONE RSP LOCK 5X30 (Screw) IMPLANT
SCREW BONE RSP LOCK 5X34 (Screw) IMPLANT
SCREW BONE RSP LOCKING 18MM LG (Screw) ×1 IMPLANT
SET HNDPC FAN SPRY TIP SCT (DISPOSABLE) ×1 IMPLANT
SLING ARM IMMOBILIZER LRG (SOFTGOODS) IMPLANT
SLING ARM IMMOBILIZER MED (SOFTGOODS) IMPLANT
STEM HUMERAL STD SHELL 14X48 (Miscellaneous) IMPLANT
STRIP CLOSURE SKIN 1/2X4 (GAUZE/BANDAGES/DRESSINGS) ×2 IMPLANT
SUCTION TUBE FRAZIER 10FR DISP (SUCTIONS) IMPLANT
SUPPORT WRAP ARM LG (MISCELLANEOUS) ×1 IMPLANT
SUT ETHIBOND 2 V 37 (SUTURE) IMPLANT
SUT FIBERWIRE #2 38 REV NDL BL (SUTURE)
SUT MNCRL AB 4-0 PS2 18 (SUTURE) ×1 IMPLANT
SUT VIC AB 2-0 CT1 TAPERPNT 27 (SUTURE) ×2 IMPLANT
SUTURE FIBERWR#2 38 REV NDL BL (SUTURE) IMPLANT
TAP SURG THRD DJ 6.5 (ORTHOPEDIC DISPOSABLE SUPPLIES) IMPLANT
TAPE LABRALWHITE 1.5X36 (TAPE) IMPLANT
TAPE SUT LABRALTAP WHT/BLK (SUTURE) IMPLANT
TOWEL OR 17X26 10 PK STRL BLUE (TOWEL DISPOSABLE) ×1 IMPLANT
TOWEL OR NON WOVEN STRL DISP B (DISPOSABLE) ×1 IMPLANT
WATER STERILE IRR 1000ML POUR (IV SOLUTION) ×1 IMPLANT

## 2023-06-13 NOTE — Transfer of Care (Signed)
Immediate Anesthesia Transfer of Care Note  Patient: Michael Pace  Procedure(s) Performed: REVERSE SHOULDER ARTHROPLASTY (Left: Shoulder)  Patient Location: PACU  Anesthesia Type:General  Level of Consciousness: awake, drowsy, and patient cooperative  Airway & Oxygen Therapy: Patient Spontanous Breathing and Patient connected to face mask oxygen  Post-op Assessment: Report given to RN and Post -op Vital signs reviewed and stable  Post vital signs: Reviewed and stable  Last Vitals:  Vitals Value Taken Time  BP 141/71 06/13/23 0853  Temp 36.4 C 06/13/23 0850  Pulse 66 06/13/23 0855  Resp 20 06/13/23 0855  SpO2 99 % 06/13/23 0855  Vitals shown include unfiled device data.  Last Pain:  Vitals:   06/13/23 0605  TempSrc:   PainSc: 0-No pain         Complications: No notable events documented.

## 2023-06-13 NOTE — H&P (Signed)
Michael Pace is an 77 y.o. male.   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  Pain interferes with sleep and quality of life.   Past Medical History:  Diagnosis Date   Arthritis    BPH (benign prostatic hyperplasia)    Bradycardia    Cataracts, bilateral    Dilated aortic root (HCC)    Dyspnea    Dysrhythmia    Family history of adverse reaction to anesthesia    Dad slow to wake up   GERD (gastroesophageal reflux disease)    Headache    Hypertension    Melanoma (HCC) 1988   LEFT ABDOMEN [place]- [place]   Nodular infiltrative basal cell carcinoma (BCC) 05/10/2021   Left Forearm - posterior   Nonrheumatic mitral valve regurgitation    Sleep apnea    Squamous cell carcinoma of skin 03/29/2016   RIGHT SIDEBURN TX= CX3 5FU    Past Surgical History:  Procedure Laterality Date   CATARACT EXTRACTION, BILATERAL     REVERSE SHOULDER ARTHROPLASTY Right 09/13/2022   Procedure: REVERSE SHOULDER ARTHROPLASTY;  Surgeon: Jones Broom, MD;  Location: [facility];  Service: Orthopedics;  Laterality: Right;   Scrotal surgery     SHOULDER ARTHROSCOPY Right    SKIN CANCER EXCISION     Melanoma   TONSILLECTOMY     TOTAL KNEE ARTHROPLASTY     bilateral    Family History  Problem Relation Age of Onset   Cancer Mother        No primary   Social History:  reports that he has never smoked. He has never used smokeless tobacco. He reports current alcohol use. He reports that he does not use drugs.  Allergies:  Allergies  Allergen Reactions   Codeine Hives, Itching and Swelling    headache   Penicillins Hives, Itching and Swelling    headache    Medications Prior to Admission  Medication Sig Dispense Refill   amLODipine (NORVASC) 5 MG tablet Take 5 mg by mouth daily.     aspirin EC 81 MG tablet Take 81 mg by mouth daily. Swallow whole.     azelastine  (ASTELIN) 0.1 % nasal spray Place 1 spray into both nostrils daily as needed for rhinitis or allergies. Use in each nostril as directed     benazepril (LOTENSIN) 20 MG tablet Take 20 mg by mouth daily.     carvedilol (COREG) 12.5 MG tablet Take 12.5 mg by mouth 2 (two) times daily.     fexofenadine (ALLEGRA) 180 MG tablet Take 180 mg by mouth daily as needed for allergies or rhinitis.     ibuprofen (ADVIL) 200 MG tablet Take 400 mg by mouth 2 (two) times daily as needed for moderate pain (pain score 4-6).     LUTEIN PO Take 25 mg by mouth daily.     Multiple Vitamin (MULTIVITAMIN WITH MINERALS) TABS tablet Take 1 tablet by mouth daily.     psyllium (REGULOID) 0.52 g capsule Take 1.04 g by mouth See admin instructions. Take 1.04 g once daily, may increase to twice daily as needed for constipation     rosuvastatin (CRESTOR) 5 MG tablet Take 5 mg by mouth daily.      No results found for this or any previous visit (from the past 48 hour(s)). No results found.  Review of Systems  All other systems reviewed and are negative.   Blood pressure  118/70, pulse 61, temperature 98.2 F (36.8 C), temperature source Oral, resp. rate 20, height 5\' 10"  (1.778 m), weight 110.7 kg, SpO2 96%. Physical Exam Constitutional:      Appearance: He is well-developed.  HENT:     Head: Atraumatic.  Eyes:     Extraocular Movements: Extraocular movements intact.  Cardiovascular:     Pulses: Normal pulses.  Pulmonary:     Effort: Pulmonary effort is normal.  Musculoskeletal:     Comments: L shoulder pain with limited ROM. NVID  Skin:    General: Skin is warm and dry.  Neurological:     Mental Status: He is alert and oriented to person, place, and time.  Psychiatric:        Mood and Affect: Mood normal.      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 Plan L reverse TSA Risks / benefits of surgery  discussed Consent on chart  NPO for OR Preop antibiotics   Glennon Hamilton, MD 06/13/2023, 6:28 AM

## 2023-06-13 NOTE — Discharge Instructions (Addendum)
Discharge Instructions after Reverse Total Shoulder Arthroplasty   . A sling has been provided for you. You are to wear this at all times (except for bathing and dressing), until your first post operative visit with Dr. Chandler. Please also wear while sleeping at night. While you bath and dress, let the arm/elbow extend straight down to stretch your elbow. Wiggle your fingers and pump your first while your in the sling to prevent hand swelling. . Use ice on the shoulder intermittently over the first 48 hours after surgery. Continue to use ice or and ice machine as needed after 48 hours for pain control/swelling.  . Pain medicine has been prescribed for you.  . Use your medicine liberally over the first 48 hours, and then you can begin to taper your use. You may take Extra Strength Tylenol or Tylenol only in place of the pain pills. DO NOT take ANY nonsteroidal anti-inflammatory pain medications: Advil, Motrin, Ibuprofen, Aleve, Naproxen or Naprosyn.  . Take one aspirin a day for 2 weeks after surgery, unless you have an aspirin sensitivity/allergy or asthma.  . Leave your dressing on until your first follow up visit.  You may shower with the dressing.  Hold your arm as if you still have your sling on while you shower. . Simply allow the water to wash over the site and then pat dry. Make sure your axilla (armpit) is completely dry after showering.    Please call [phone] during normal business hours or [phone] after hours for any problems. Including the following:  - excessive redness of the incisions - drainage for more than 4 days - fever of more than 101.5 F  *Please note that pain medications will not be refilled after hours or on weekends.    Dental Antibiotics:  In most cases prophylactic antibiotics for Dental procdeures after total joint surgery are not necessary.  Exceptions are as follows:  1. History of prior total joint infection  2. Severely immunocompromised  (Organ Transplant, cancer chemotherapy, Rheumatoid biologic meds such as Humera)  3. Poorly controlled diabetes (A1C &gt; 8.0, blood glucose over 200)  If you have one of these conditions, contact your surgeon for an antibiotic prescription, prior to your dental procedure. 

## 2023-06-13 NOTE — Evaluation (Signed)
Occupational Therapy Evaluation Patient Details Name: Michael Pace MRN: [id number] DOB: 21-Sep-1945 Today's Date: 06/13/2023   History of Present Illness Pt is a 77 y.o. s/p L reverse shoulder arthroplasty on 06/13/23.   Clinical Impression   Pt is s/p L reverse shoulder arthroplasty on 06/13/23. Pt independent and lives with spouse in second floor apartment with elevator. Has all necessary DME needs. Therapist provided education and instruction to patient and spouse in regards to exercises, precautions, positioning, donning upper extremity clothing and bathing while maintaining shoulder precautions, ice and edema management, use of ice machine and donning/doffing sling. Patient and spouse verbalized understanding and demonstrated as needed. Good recall and understanding throughout. Pt with mild c/o dizziness, BP 123/83 (pt states vertigo at baseline). Patient needed assistance to donn shirt, underwear, pants, socks and shoes and provided with instruction on compensatory strategies to perform ADLs. Functional mobility completed with CGA - supervision. Patient to follow up with MD for further therapy needs.        If plan is discharge home, recommend the following: A little help with walking and/or transfers;A lot of help with bathing/dressing/bathroom;Assist for transportation;Help with stairs or ramp for entrance;Assistance with cooking/housework    Functional Status Assessment  Patient has had a recent decline in their functional status and demonstrates the ability to make significant improvements in function in a reasonable and predictable amount of time.  Equipment Recommendations  None recommended by OT       Precautions / Restrictions Precautions Precautions: Shoulder Shoulder Interventions: Shoulder sling/immobilizer;Off for dressing/bathing/exercises Precaution Booklet Issued: Yes (comment) Precaution Comments: Sling at all times except ADL/exercise, NWB & AROM elbow, wrist and  hand to tolerance Required Braces or Orthoses: Sling Restrictions Weight Bearing Restrictions: Yes LUE Weight Bearing: Non weight bearing Other Position/Activity Restrictions: Sling on at all times except for ADLs/exercise      Mobility Bed Mobility                    Transfers Overall transfer level: Needs assistance Equipment used: None Transfers: Sit to/from Stand Sit to Stand: Contact guard assist, Supervision           General transfer comment: initally stands with wide BOS, able to correct with cues      Balance Overall balance assessment: Mild deficits observed, not formally tested (pt has vertigo and positional changes at baseline)                                         ADL either performed or assessed with clinical judgement   ADL                                         General ADL Comments: Shoulder education and UB ADL dressing techniques performed during eval. Spouse present and verbalizing understanding.     Vision Baseline Vision/History: 1 Wears glasses              Pertinent Vitals/Pain Pain Assessment Pain Assessment: No/denies pain     Extremity/Trunk Assessment Upper Extremity Assessment Upper Extremity Assessment: Right hand dominant;LUE deficits/detail LUE: Unable to fully assess due to immobilization   Lower Extremity Assessment Lower Extremity Assessment: Overall WFL for tasks assessed   Cervical / Trunk Assessment Cervical / Trunk Assessment: Normal   Communication Communication Communication: No  apparent difficulties Cueing Techniques: Verbal cues;Tactile cues;Visual cues   Cognition Arousal: Suspect due to medications (slightly drowsy at times) Behavior During Therapy: [facility] for tasks assessed/performed Overall Cognitive Status: Within Functional Limits for tasks assessed                                 General Comments: pt slightly drowsy post-surgery, but able to  answer questions, recall precautions and return demonstrate with incerased time     General Comments  Shoulder dressing intact    Exercises Exercises: Shoulder Shoulder Exercises Elbow Flexion: Right, 5 reps Elbow Extension: Right, 5 reps Wrist Flexion: Right, 5 reps Wrist Extension: Right, 5 reps Digit Composite Flexion: Right, 5 reps Composite Extension: Right, 5 reps   Shoulder Instructions Shoulder Instructions Donning/doffing shirt without moving shoulder: Minimal assistance (performs with min vcs to depress shoulder) Method for sponge bathing under operated UE: Minimal assistance (spouse and pt return verbalized without cues) Donning/doffing sling/immobilizer: Minimal assistance (pt and spouse verbalize understanding) Correct positioning of sling/immobilizer: Supervision/safety ROM for elbow, wrist and digits of operated UE: Supervision/safety (pt return demos on unaffected RUE) Sling wearing schedule (on at all times/off for ADL's): Supervision/safety (correctly verbalizes wearing schedule when prompted) Proper positioning of operated UE when showering: Supervision/safety Positioning of UE while sleeping: Supervision/safety (will sleep in lift chair, verbalizes pillow positioning)    Home Living Family/patient expects to be discharged to:: Private residence Living Arrangements: Spouse/significant other Available Help at Discharge: Family;Available 24 hours/day Type of Home: Apartment Home Access: Elevator;Other (comment) (2nd floor apartment with elevator and stairs)     Home Layout: One level     Bathroom Shower/Tub: Producer, television/film/video: Handicapped height Bathroom Accessibility: Yes   Home Equipment: Lift chair;Wheelchair - manual;Hand held shower head;Grab bars - tub/shower;Grab bars - toilet;Shower seat - built Magazine features editor (2 wheels);Cane - single point          Prior Functioning/Environment Prior Level of Function :  Independent/Modified Independent             Mobility Comments: independent, no recent falls ADLs Comments: indpendent         AM-PAC OT "6 Clicks" Daily Activity     Outcome Measure Help from another person eating meals?: None Help from another person taking care of personal grooming?: None Help from another person toileting, which includes using toliet, bedpan, or urinal?: A Little Help from another person bathing (including washing, rinsing, drying)?: A Little Help from another person to put on and taking off regular upper body clothing?: A Lot Help from another person to put on and taking off regular lower body clothing?: A Little 6 Click Score: 19   End of Session Equipment Utilized During Treatment: Gait belt;Other (comment) (Shld sling) Nurse Communication: Weight bearing status;Mobility status;Other (comment) (shoulder education complete)  Activity Tolerance: Patient tolerated treatment well Patient left: in chair;with call bell/phone within reach;with family/visitor present;with nursing/sitter in room                   Time: 1013-1104 OT Time Calculation (min): 51 min Charges:  OT General Charges $OT Visit: 1 Visit OT Evaluation $OT Eval Low Complexity: 1 Low OT Treatments $Self Care/Home Management : 23-37 mins Keniyah Gelinas L. Chalet Kerwin, OTR/L  06/13/23, 11:35 AM

## 2023-06-13 NOTE — Anesthesia Procedure Notes (Signed)
Anesthesia Regional Block: Interscalene brachial plexus block   Pre-Anesthetic Checklist: , timeout performed,  Correct Patient, Correct Site, Correct Laterality,  Correct Procedure, Correct Position, site marked,  Risks and benefits discussed,  Surgical consent,  Pre-op evaluation,  At surgeon's request and post-op pain management  Laterality: Left  Prep: chloraprep       Needles:  Injection technique: Single-shot  Needle Type: Echogenic Stimulator Needle     Needle Length: 9cm  Needle Gauge: 21     Additional Needles:   Procedures:,,,, ultrasound used (permanent image in chart),,    Narrative:  Start time: 06/13/2023 7:15 AM End time: 06/13/2023 7:20 AM Injection made incrementally with aspirations every 5 mL.  Performed by: Personally  Anesthesiologist: Shelton Silvas, MD  Additional Notes: Discussed risks and benefits of the nerve block in detail, including but not limited vascular injury, permanent nerve damage and infection.   Patient tolerated the procedure well. Local anesthetic introduced in an incremental fashion under minimal resistance after negative aspirations. No paresthesias were elicited. After completion of the procedure, no acute issues were identified and patient continued to be monitored by RN.

## 2023-06-13 NOTE — Op Note (Signed)
Procedure(s): REVERSE SHOULDER ARTHROPLASTY Procedure Note  Nathon Marple male 77 y.o. 06/13/2023  Preoperative diagnosis: Left shoulder end-stage osteoarthritis with rotator cuff disease  Postoperative diagnosis: Same  Procedure(s) and Anesthesia Type:    * REVERSE SHOULDER ARTHROPLASTY - Choice   Indications:  77 y.o. male  With endstage left shoulder arthritis with rotator cuff disease. Pain and dysfunction interfered with quality of life and nonoperative treatment with activity modification, NSAIDS and injections failed.     Surgeon: Glennon Hamilton   Assistants: Fredia Sorrow PA-C Amber was present and scrubbed throughout the procedure and was essential in positioning, retraction, exposure, and closure)  Anesthesia: General endotracheal anesthesia with preoperative interscalene block given by the attending anesthesiologist    Procedure Detail  REVERSE SHOULDER ARTHROPLASTY   Estimated Blood Loss:  200 mL         Drains: none  Blood Given: none          Specimens: none        Complications:  * No complications entered in OR log *         Disposition: PACU - hemodynamically stable.         Condition: stable      OPERATIVE FINDINGS:  A DJO Altivate pressfit reverse total shoulder arthroplasty was placed with a  size 14 stem, a 36 standard glenosphere, and a standard-mm poly insert. The base plate  fixation was excellent.  PROCEDURE: The patient was identified in the preoperative holding area  where I personally marked the operative site after verifying site, side,  and procedure with the patient. An interscalene block given by  the attending anesthesiologist in the holding area and the patient was taken back to the operating room where all extremities were  carefully padded in position after general anesthesia was induced. She  was placed in a beach-chair position and the operative upper extremity was  prepped and draped in a standard sterile  fashion. An approximately 10-  cm incision was made from the tip of the coracoid process to the center  point of the humerus at the level of the axilla. Dissection was carried  down through subcutaneous tissues to the level of the cephalic vein  which was taken laterally with the deltoid. The pectoralis major was  retracted medially. The subdeltoid space was developed and the lateral  edge of the conjoined tendon was identified. The undersurface of  conjoined tendon was palpated and the musculocutaneous nerve was not in  the field. Retractor was placed underneath the conjoined and second  retractor was placed lateral into the deltoid. The circumflex humeral  artery and vessels were identified and clamped and coagulated. The  biceps tendon was tenodesed to the upper border of the pectoralis major.  The subscapularis was taken down as a peel with the underlying capsule.  The  joint was then gently externally rotated while the capsule was released  from the humeral neck around to just beyond the 6 o'clock position. At  this point, the joint was dislocated and the humeral head was presented  into the wound. The excessive osteophyte formation was removed with a  large rongeur.  The cutting guide was used to make the appropriate  head cut and the head was saved for potentially bone grafting.  The glenoid was exposed with the arm in an  abducted extended position. The anterior and posterior labrum were  completely excised and the capsule was released circumferentially to  allow for exposure of the glenoid for preparation.  The 2.5 mm drill was  placed using the guide in 5-10 inferior angulation and the tap was then advanced in the same hole. Small and large reamers were then used. The tap was then removed and the Metaglene was then screwed in with excellent purchase.  The peripheral guide was then used to drilled measured and filled peripheral locking screws. The size 36 standard glenosphere was then  impacted on the [facility] taper and the central screw was placed. The humerus was then again exposed and the diaphyseal reamers were used followed by the metaphyseal reamers. The final broach was left in place in the proximal trial was placed. The joint was reduced and with this implant it was felt that soft tissue tensioning was appropriate with excellent stability and excellent range of motion. Therefore, final humeral stem was placed press-fit.  And then the trial polyethylene inserts were tested again and the above implant was felt to be the most appropriate for final insertion. The joint was reduced taken through full range of motion and felt to be stable. Soft tissue tension was appropriate.  The joint was then copiously irrigated with pulse  lavage and the wound was then closed. The subscapularis was repaired with 2 labral tapes previously placed through bone tunnels and around the implant..  Skin was closed with 2-0 Vicryl in a deep dermal layer and 4-0  Monocryl for skin closure. Steri-Strips were applied. Sterile  dressings were then applied as well as a sling. The patient was allowed  to awaken from general anesthesia, transferred to stretcher, and taken  to recovery room in stable condition.   POSTOPERATIVE PLAN: The patient will be observed in the recovery room and if his pain is well-controlled with regional anesthesia and he is hemodynamically stable he can be discharged home today with family.

## 2023-06-13 NOTE — Anesthesia Procedure Notes (Signed)
Procedure Name: Intubation Date/Time: 06/13/2023 7:41 AM  Performed by: Sindy Guadeloupe, CRNAPre-anesthesia Checklist: Patient identified, Emergency Drugs available, Suction available, Patient being monitored and Timeout performed Patient Re-evaluated:Patient Re-evaluated prior to induction Oxygen Delivery Method: Circle system utilized Preoxygenation: Pre-oxygenation with 100% oxygen Induction Type: IV induction Ventilation: Mask ventilation without difficulty Laryngoscope Size: Mac and 4 Grade View: Grade I Tube type: Oral Tube size: 7.5 mm Number of attempts: 1 Airway Equipment and Method: Stylet Placement Confirmation: ETT inserted through vocal cords under direct vision, positive ETCO2 and breath sounds checked- equal and bilateral Secured at: 23 cm Tube secured with: Tape Dental Injury: Teeth and Oropharynx as per pre-operative assessment

## 2023-06-13 NOTE — Anesthesia Postprocedure Evaluation (Signed)
Anesthesia Post Note  Patient: Michael Pace  Procedure(s) Performed: REVERSE SHOULDER ARTHROPLASTY (Left: Shoulder)     Patient location during evaluation: PACU Anesthesia Type: General Level of consciousness: awake and alert Pain management: pain level controlled Vital Signs Assessment: post-procedure vital signs reviewed and stable Respiratory status: spontaneous breathing, nonlabored ventilation, respiratory function stable and patient connected to nasal cannula oxygen Cardiovascular status: blood pressure returned to baseline and stable Postop Assessment: no apparent nausea or vomiting Anesthetic complications: no   No notable events documented.  Last Vitals:  Vitals:   06/13/23 0945 06/13/23 1004  BP: 131/77 116/77  Pulse: (!) 59 (!) 56  Resp: 18 16  Temp: (!) 36.4 C (!) 36.4 C  SpO2: 91% 94%    Last Pain:  Vitals:   06/13/23 1004  TempSrc: Oral  PainSc: 0-No pain                 Shelton Silvas

## 2023-06-14 ENCOUNTER — Encounter (HOSPITAL_COMMUNITY): Payer: Self-pay | Admitting: Orthopedic Surgery

## 2023-07-02 ENCOUNTER — Other Ambulatory Visit: Payer: Self-pay | Admitting: *Deleted

## 2023-07-02 DIAGNOSIS — I77810 Thoracic aortic ectasia: Secondary | ICD-10-CM

## 2023-08-15 NOTE — Progress Notes (Signed)
Cardiology Office Note:   Date:  08/16/2023  ID:  Michael Pace, DOB 1946-02-27, MRN [id number] PCP: Theodis Shove, DO  [facility] Providers Cardiologist:  Rollene Rotunda, MD {  History of Present Illness:   Michael Pace is a 78 y.o. male  was referred by Michael Perches, NP for evaluation of bradycardia and SOB.    He moved from [place].  He has a history of tachycardia.   He saw me because of palpitations.  He also had dyspnea.  He had his beta blocker decreased.    Echo demonstrated mild/mod MR and mild AI.  His EF was normal.  BNP and TSH were normal.  Records from [facility] demonstrated a perfusion study in 2007 with no ischemia.  Echocardiogram in 2006 and again in 2016 demonstrated a normal ejection fraction.  There is a report of mild coronary disease in 2002.  He had an SVT and an EP study in 2009.  No pathway was identified with no inducible arrhythmias.    Since I last saw him he has had a second shoulder surgery which is his second replacement and 1 year.  He has not had both replaced.  He has not been as active.  He is done some physical therapy.  He is moved into a retirement home.  Was put on a little weight.  He has had a little lower extremity swelling but again has not been mobile and probably getting a little more salt.  He has had a little more dyspnea with exertion but is not describing PND or orthopnea.  Has had no new palpitations, presyncope or syncope.  He is not describing any chest discomfort, neck or arm discomfort.  ROS: As stated in the HPI and negative for all other systems.  Studies Reviewed:    EKG:   EKG Interpretation Date/Time:  Friday August 16 2023 09:52:33 EST Ventricular Rate:  61 PR Interval:  168 QRS Duration:  94 QT Interval:  414 QTC Calculation: 416 R Axis:   -11  Text Interpretation: Normal sinus rhythm Possible Inferior infarct , age undetermined Poor atnerior R wave progression No significant change since last  tracing Confirmed by Rollene Rotunda ([id number]) on 08/16/2023 10:12:16 AM     Risk Assessment/Calculations:     Physical Exam:   VS:  BP 122/80 (Cuff Size: Large)   Pulse 62   Ht 5\' 10"  (1.778 m)   Wt 252 lb (114.3 kg)   SpO2 94%   BMI 36.16 kg/m    Wt Readings from Last 3 Encounters:  08/16/23 252 lb (114.3 kg)  06/13/23 244 lb (110.7 kg)  06/07/23 244 lb (110.7 kg)     GEN: Well nourished, well developed in no acute distress NECK: No JVD; No carotid bruits CARDIAC:  RRR, 2 of 6 apical systolic murmur radiating slightly to the axilla, no diastolic murmurs, rubs, gallops RESPIRATORY:  Clear to auscultation without rales, wheezing or rhonchi  ABDOMEN: Soft, non-tender, non-distended EXTREMITIES: Mild bilateral leg edema; No deformity   ASSESSMENT AND PLAN:   BRADYCARDIA:   He has no symptoms related to this.  No change in therapy.  MR:   Moderate on echo in June 2024.  I will repeat this echo in June of this year.      AORTIC ROOT :   In January 2024 his echo was 40 mm.  I will follow this up in January 2026.        Follow up with me in one year.  Signed, Rollene Rotunda, MD

## 2023-08-16 ENCOUNTER — Encounter: Payer: Self-pay | Admitting: Cardiology

## 2023-08-16 ENCOUNTER — Ambulatory Visit: Payer: Medicare Other | Attending: Cardiology | Admitting: Cardiology

## 2023-08-16 VITALS — BP 122/80 | HR 62 | Ht 70.0 in | Wt 252.0 lb

## 2023-08-16 DIAGNOSIS — I77810 Thoracic aortic ectasia: Secondary | ICD-10-CM | POA: Diagnosis present

## 2023-08-16 DIAGNOSIS — R001 Bradycardia, unspecified: Secondary | ICD-10-CM | POA: Diagnosis present

## 2023-08-16 DIAGNOSIS — I340 Nonrheumatic mitral (valve) insufficiency: Secondary | ICD-10-CM | POA: Diagnosis present

## 2023-08-16 DIAGNOSIS — I351 Nonrheumatic aortic (valve) insufficiency: Secondary | ICD-10-CM

## 2023-08-16 NOTE — Patient Instructions (Signed)
Medication Instructions:  No changes.  *If you need a refill on your cardiac medications before your next appointment, please call your pharmacy*   Testing/Procedures: Your physician has requested that you have an echocardiogram due in [place].  Echocardiography is a painless test that uses sound waves to create images of your heart. It provides your doctor with information about the size and shape of your heart and how well your heart's chambers and valves are working. This procedure takes approximately one hour. There are no restrictions for this procedure. [address]  Please do NOT wear cologne, perfume, aftershave, or lotions (deodorant is allowed). Please arrive 15 minutes prior to your appointment time.  Please note: We ask at that you not bring children with you during ultrasound (echo/ vascular) testing. Due to room size and safety concerns, children are not allowed in the ultrasound rooms during exams. Our front office staff cannot provide observation of children in our lobby area while testing is being conducted. An adult accompanying a patient to their appointment will only be allowed in the ultrasound room at the discretion of the ultrasound technician under special circumstances. We apologize for any inconvenience.    Follow-Up: At [facility], you and your health needs are our priority.  As part of our continuing mission to provide you with exceptional heart care, we have created designated Provider Care Teams.  These Care Teams include your primary Cardiologist (physician) and Advanced Practice Providers (APPs -  Physician Assistants and Nurse Practitioners) who all work together to provide you with the care you need, when you need it.  We recommend signing up for the patient portal called "MyChart".  Sign up information is provided on this After Visit Summary.  MyChart is used to connect with patients for Virtual Visits (Telemedicine).  Patients are able to view  lab/test results, encounter notes, upcoming appointments, etc.  Non-urgent messages can be sent to your provider as well.   To learn more about what you can do with MyChart, go to [email or web address].    Your next appointment:   1 year(s)  Provider:   Rollene Rotunda, MD     Other Instructions You should receive a call from [facility] hospital to schedule you CT of your aorta.

## 2023-10-04 HISTORY — PX: SHOULDER ARTHROSCOPY: SHX128

## 2023-12-12 ENCOUNTER — Encounter: Payer: Self-pay | Admitting: Nurse Practitioner

## 2023-12-12 ENCOUNTER — Ambulatory Visit: Admitting: Nurse Practitioner

## 2023-12-12 VITALS — BP 132/84 | HR 68 | Temp 98.4°F | Ht 70.0 in | Wt 255.0 lb

## 2023-12-12 DIAGNOSIS — Z1321 Encounter for screening for nutritional disorder: Secondary | ICD-10-CM

## 2023-12-12 DIAGNOSIS — E785 Hyperlipidemia, unspecified: Secondary | ICD-10-CM | POA: Insufficient documentation

## 2023-12-12 DIAGNOSIS — I10 Essential (primary) hypertension: Secondary | ICD-10-CM | POA: Diagnosis not present

## 2023-12-12 DIAGNOSIS — M150 Primary generalized (osteo)arthritis: Secondary | ICD-10-CM | POA: Diagnosis not present

## 2023-12-12 DIAGNOSIS — K5901 Slow transit constipation: Secondary | ICD-10-CM | POA: Diagnosis not present

## 2023-12-12 DIAGNOSIS — G629 Polyneuropathy, unspecified: Secondary | ICD-10-CM | POA: Insufficient documentation

## 2023-12-12 DIAGNOSIS — R350 Frequency of micturition: Secondary | ICD-10-CM | POA: Insufficient documentation

## 2023-12-12 DIAGNOSIS — E782 Mixed hyperlipidemia: Secondary | ICD-10-CM | POA: Diagnosis not present

## 2023-12-12 DIAGNOSIS — K219 Gastro-esophageal reflux disease without esophagitis: Secondary | ICD-10-CM | POA: Insufficient documentation

## 2023-12-12 DIAGNOSIS — M159 Polyosteoarthritis, unspecified: Secondary | ICD-10-CM | POA: Insufficient documentation

## 2023-12-12 DIAGNOSIS — G609 Hereditary and idiopathic neuropathy, unspecified: Secondary | ICD-10-CM

## 2023-12-12 DIAGNOSIS — E559 Vitamin D deficiency, unspecified: Secondary | ICD-10-CM

## 2023-12-12 NOTE — Assessment & Plan Note (Signed)
on Rosuvastatin

## 2023-12-12 NOTE — Patient Instructions (Signed)
 Fasting labs on Tuesday 12/17/23 @ 7:00 am @ [facility]

## 2023-12-12 NOTE — Assessment & Plan Note (Signed)
 Self reported numbness in toes occasionally, check Vit B12 level.

## 2023-12-12 NOTE — Assessment & Plan Note (Addendum)
 hx of R+L knee replacement, R+L shoulder replacement, R hip pain, lower back pain. PT to eval and treatment.

## 2023-12-12 NOTE — Progress Notes (Signed)
 Location:   [facility]   Place of Service:    Provider: Kerman Peck NP  Kamylle Axelson X, NP  Patient Care Team: Tashawnda Bleiler X, NP as PCP - General (Internal Medicine) Eilleen Grates, MD as PCP - Cardiology (Cardiology) Devon Fogo, MD (Inactive) as Consulting Physician (Dermatology)  Extended Emergency Contact Information Primary Emergency Contact: Ginsberg,Eilleen Address: [address]          [place], [place] [address] [place] Home Phone: [phone] Mobile Phone: [phone] Relation: Spouse  Code Status:  DNR Goals of care: Advanced Directive information    06/13/2023    6:04 AM  Advanced Directives  Does Patient Have a Medical Advance Directive? Yes  Type of Estate agent of [place];Living will  Does patient want to make changes to medical advance directive? No - Patient declined  Copy of Healthcare Power of Attorney in Chart? Yes - validated most recent copy scanned in chart (See row information)     Chief Complaint  Patient presents with   Establish Care    New patient to establish care. Patient c/o SOB, patient gets tired easily, history of lung issues. Patient with area of concern on upper right shoulder, and patient c/o trouble remembering, seen neurologist several years ago . Patient c/o drainage x sever years. Pill bottle not present at initial appointment.     HPI:  Pt is a 78 y.o. male seen today for medical management of chronic diseases.      HTN, on Carvedilol, Amlodipine, Benazepril, ASA, Bun/creat 16/0.75 06/07/23  HLD, on Rosuvastatin  Constipation, Psyllium, diet.   OA, hx of R+L knee replacement, R+L shoulder replacement.   GERD, not taking acid reducer.   Urinary frequency, not new, q2-3 hour at night.   Past Medical History:  Diagnosis Date   Arthritis    BPH (benign prostatic hyperplasia)    Bradycardia    Cataracts, bilateral    Dilated aortic root (HCC)    Dyspnea    Dysrhythmia    Family  history of adverse reaction to anesthesia    Dad slow to wake up   GERD (gastroesophageal reflux disease)    Headache    Hypertension    Melanoma (HCC) 1988   LEFT ABDOMEN [place]- [place]    Nodular infiltrative basal cell carcinoma (BCC) 05/10/2021   Left Forearm - posterior   Nonrheumatic mitral valve regurgitation    Sleep apnea    Squamous cell carcinoma of skin 03/29/2016   RIGHT SIDEBURN TX= CX3 5FU   Past Surgical History:  Procedure Laterality Date   CATARACT EXTRACTION, BILATERAL     REVERSE SHOULDER ARTHROPLASTY Right 09/13/2022   Procedure: REVERSE SHOULDER ARTHROPLASTY;  Surgeon: Sammye Cristal, MD;  Location: [facility];  Service: Orthopedics;  Laterality: Right;   REVERSE SHOULDER ARTHROPLASTY Left 06/13/2023   Procedure: REVERSE SHOULDER ARTHROPLASTY;  Surgeon: Sammye Cristal, MD;  Location: [facility];  Service: Orthopedics;  Laterality: Left;   Scrotal surgery     SHOULDER ARTHROSCOPY Right 10/04/2023   SKIN CANCER EXCISION     Melanoma   TONSILLECTOMY     TOTAL KNEE ARTHROPLASTY     bilateral    Allergies  Allergen Reactions   Codeine Hives, Itching and Swelling    headache   Penicillins Hives, Itching and Swelling    headache    Allergies as of 12/12/2023       Reactions   Codeine Hives, Itching, Swelling   headache   Penicillins Hives, Itching, Swelling  headache        Medication List        Accurate as of Dec 12, 2023 11:59 PM. If you have any questions, ask your nurse or doctor.          amLODipine 5 MG tablet Commonly known as: NORVASC Take 5 mg by mouth daily.   aspirin EC 81 MG tablet Take 81 mg by mouth daily. Swallow whole.   azelastine 0.1 % nasal spray Commonly known as: ASTELIN Place 1 spray into both nostrils daily as needed for rhinitis or allergies. Use in each nostril as directed   benazepril 20 MG tablet Commonly known as: LOTENSIN Take 20 mg by mouth daily.   carvedilol 12.5 MG tablet Commonly known as: COREG Take  12.5 mg by mouth 2 (two) times daily.   fexofenadine 180 MG tablet Commonly known as: ALLEGRA Take 180 mg by mouth daily as needed for allergies or rhinitis.   LUTEIN PO Take 25 mg by mouth daily.   multivitamin with minerals Tabs tablet Take 1 tablet by mouth daily.   psyllium 0.52 g capsule Commonly known as: REGULOID Take 1.04 g by mouth See admin instructions. Take 1.04 g once daily, may increase to twice daily as needed for constipation   rosuvastatin 5 MG tablet Commonly known as: CRESTOR Take 5 mg by mouth daily.        Review of Systems  Constitutional:  Negative for appetite change and fatigue.  HENT:  Positive for postnasal drip. Negative for congestion and trouble swallowing.        Lifelong nose reconstruction surgery 78 year old.   Eyes:  Negative for visual disturbance.  Respiratory:  Negative for cough and shortness of breath.   Cardiovascular:  Negative for leg swelling.  Gastrointestinal:  Negative for abdominal pain and constipation.  Genitourinary:  Positive for frequency. Negative for dysuria and urgency.  Musculoskeletal:  Positive for arthralgias and back pain.  Neurological:  Negative for weakness and headaches.       Occasional numbness in toes.   Psychiatric/Behavioral:  Negative for sleep disturbance. The patient is not nervous/anxious.     Immunization History  Administered Date(s) Administered   Fluad Quad(high Dose 65+) 03/17/2020   Influenza, High Dose Seasonal PF 03/21/2021, 05/13/2023   PFIZER(Purple Top)SARS-COV-2 Vaccination 08/27/2019, 09/22/2019, 05/06/2020, 06/02/2021   Pfizer(Comirnaty)Fall Seasonal Vaccine 12 years and older 04/30/2022   Pneumococcal Conjugate-13 08/01/2021   Pneumococcal Polysaccharide-23 08/01/2021   Tdap 07/26/2015   Zoster Recombinant(Shingrix) 05/24/2020, 09/12/2020   Pertinent  Health Maintenance Due  Topic Date Due   INFLUENZA VACCINE  02/21/2024      06/23/2020    1:43 PM 12/12/2023    2:30 PM  Fall  Risk  Falls in the past year? 0 0  Was there an injury with Fall?  0  Fall Risk Category Calculator  0  Patient at Risk for Falls Due to  No Fall Risks  Fall risk Follow up  Falls evaluation completed   Functional Status Survey:    Vitals:   12/12/23 1425  BP: 132/84  Pulse: 68  Temp: 98.4 F (36.9 C)  SpO2: 98%  Weight: 255 lb (115.7 kg)  Height: 5\' 10"  (1.778 m)   Body mass index is 36.59 kg/m. Physical Exam Vitals and nursing note reviewed.  Constitutional:      Appearance: Normal appearance.  HENT:     Head: Normocephalic and atraumatic.     Nose: Nose normal.     Mouth/Throat:  Mouth: Mucous membranes are moist.  Eyes:     Extraocular Movements: Extraocular movements intact.     Conjunctiva/sclera: Conjunctivae normal.     Pupils: Pupils are equal, round, and reactive to light.  Cardiovascular:     Rate and Rhythm: Normal rate and regular rhythm.     Heart sounds: No murmur heard. Pulmonary:     Effort: Pulmonary effort is normal.     Breath sounds: No wheezing, rhonchi or rales.  Abdominal:     General: Bowel sounds are normal.     Palpations: Abdomen is soft.     Tenderness: There is no abdominal tenderness.  Musculoskeletal:        General: No tenderness. Normal range of motion.     Cervical back: Normal range of motion and neck supple.     Right lower leg: No edema.     Left lower leg: No edema.  Skin:    General: Skin is warm and dry.     Findings: No rash.  Neurological:     General: No focal deficit present.     Mental Status: He is alert and oriented to person, place, and time. Mental status is at baseline.     Motor: No weakness.     Coordination: Coordination normal.     Gait: Gait abnormal.  Psychiatric:        Mood and Affect: Mood normal.        Behavior: Behavior normal.        Thought Content: Thought content normal.        Judgment: Judgment normal.     Labs reviewed: Recent Labs    06/07/23 1037  NA 138  K 4.9  CL 106   CO2 24  GLUCOSE 107*  BUN 16  CREATININE 0.75  CALCIUM 9.2   No results for input(s): "AST", "ALT", "ALKPHOS", "BILITOT", "PROT", "ALBUMIN" in the last 8760 hours. Recent Labs    06/07/23 1037  WBC 7.3  HGB 15.0  HCT 47.0  MCV 94.9  PLT 168   Lab Results  Component Value Date   TSH 2.320 12/01/2019   No results found for: "HGBA1C" No results found for: "CHOL", "HDL", "LDLCALC", "LDLDIRECT", "TRIG", "CHOLHDL"  Significant Diagnostic Results in last 30 days:  No results found.  Assessment/Plan  HTN (hypertension) Blood pressure is controlled, on Carvedilol, Amlodipine, Benazepril, ASA, Bun/creat 16/0.75 06/07/23  HLD (hyperlipidemia) on Rosuvastatin  Slow transit constipation Stable, on Psyllium, diet.   Osteoarthritis, multiple sites  hx of R+L knee replacement, R+L shoulder replacement, R hip pain, lower back pain. PT to eval and treatment.    GERD (gastroesophageal reflux disease) Not taking acid reducer  Urinary frequency Check PSA per patient's preference.   Peripheral neuropathy Self reported numbness in toes occasionally, check Vit B12 level.     Family/ staff Communication: plan of care reviewed with the patient   Labs/tests ordered:  CBC/diff, CMP/eGFR, TSH, lipids, Hgb A1c, Vit D, Vit B12  F/u 2-3 weeks

## 2023-12-12 NOTE — Assessment & Plan Note (Signed)
 Not taking acid reducer

## 2023-12-12 NOTE — Assessment & Plan Note (Signed)
 Blood pressure is controlled, on Carvedilol, Amlodipine, Benazepril, ASA, Bun/creat 16/0.75 06/07/23

## 2023-12-12 NOTE — Assessment & Plan Note (Addendum)
 Check PSA per patient's preference.

## 2023-12-12 NOTE — Assessment & Plan Note (Signed)
 Stable, on Psyllium, diet.

## 2023-12-20 LAB — COMPLETE METABOLIC PANEL WITHOUT GFR
AG Ratio: 1.9 (calc) (ref 1.0–2.5)
ALT: 21 U/L (ref 9–46)
AST: 20 U/L (ref 10–35)
Albumin: 4.2 g/dL (ref 3.6–5.1)
Alkaline phosphatase (APISO): 58 U/L (ref 35–144)
BUN: 21 mg/dL (ref 7–25)
CO2: 26 mmol/L (ref 20–32)
Calcium: 9.2 mg/dL (ref 8.6–10.3)
Chloride: 106 mmol/L (ref 98–110)
Creat: 0.82 mg/dL (ref 0.70–1.28)
Globulin: 2.2 g/dL (ref 1.9–3.7)
Glucose, Bld: 104 mg/dL — ABNORMAL HIGH (ref 65–99)
Potassium: 4 mmol/L (ref 3.5–5.3)
Sodium: 140 mmol/L (ref 135–146)
Total Bilirubin: 0.6 mg/dL (ref 0.2–1.2)
Total Protein: 6.4 g/dL (ref 6.1–8.1)

## 2023-12-20 LAB — CBC WITH DIFFERENTIAL/PLATELET
Absolute Lymphocytes: 1434 {cells}/uL (ref 850–3900)
Absolute Monocytes: 570 {cells}/uL (ref 200–950)
Basophils Absolute: 32 {cells}/uL (ref 0–200)
Basophils Relative: 0.5 %
Eosinophils Absolute: 166 {cells}/uL (ref 15–500)
Eosinophils Relative: 2.6 %
HCT: 47.8 % (ref 38.5–50.0)
Hemoglobin: 15.4 g/dL (ref 13.2–17.1)
MCH: 30.2 pg (ref 27.0–33.0)
MCHC: 32.2 g/dL (ref 32.0–36.0)
MCV: 93.7 fL (ref 80.0–100.0)
MPV: 12.5 fL (ref 7.5–12.5)
Monocytes Relative: 8.9 %
Neutro Abs: 4198 {cells}/uL (ref 1500–7800)
Neutrophils Relative %: 65.6 %
Platelets: 162 10*3/uL (ref 140–400)
RBC: 5.1 10*6/uL (ref 4.20–5.80)
RDW: 12.5 % (ref 11.0–15.0)
Total Lymphocyte: 22.4 %
WBC: 6.4 10*3/uL (ref 3.8–10.8)

## 2023-12-20 LAB — TSH: TSH: 2.35 m[IU]/L (ref 0.40–4.50)

## 2023-12-20 LAB — VITAMIN D 1,25 DIHYDROXY
Vitamin D 1, 25 (OH)2 Total: 27 pg/mL (ref 18–72)
Vitamin D2 1, 25 (OH)2: <8 pg/mL
Vitamin D3 1, 25 (OH)2: 27 pg/mL

## 2023-12-20 LAB — VITAMIN B12: Vitamin B-12: 747 pg/mL (ref 200–1100)

## 2023-12-20 LAB — LIPID PANEL
Cholesterol: 181 mg/dL (ref <200)
HDL: 61 mg/dL (ref >=40)
LDL Cholesterol (Calc): 99 mg/dL
Non-HDL Cholesterol (Calc): 120 mg/dL (ref <130)
Total CHOL/HDL Ratio: 3 (calc) (ref <5.0)
Triglycerides: 109 mg/dL (ref <150)

## 2023-12-20 LAB — PSA: PSA: 2.9 ng/mL (ref <=4.00)

## 2023-12-27 ENCOUNTER — Ambulatory Visit: Admitting: Sports Medicine

## 2023-12-27 ENCOUNTER — Encounter: Payer: Self-pay | Admitting: Sports Medicine

## 2023-12-27 VITALS — BP 108/72 | HR 63 | Temp 97.7°F | Resp 17 | Ht 70.0 in | Wt 257.8 lb

## 2023-12-27 DIAGNOSIS — E782 Mixed hyperlipidemia: Secondary | ICD-10-CM | POA: Diagnosis not present

## 2023-12-27 DIAGNOSIS — R053 Chronic cough: Secondary | ICD-10-CM | POA: Diagnosis not present

## 2023-12-27 DIAGNOSIS — R0602 Shortness of breath: Secondary | ICD-10-CM

## 2023-12-27 DIAGNOSIS — I10 Essential (primary) hypertension: Secondary | ICD-10-CM | POA: Diagnosis not present

## 2023-12-27 NOTE — Progress Notes (Signed)
 Careteam:          [facility]  Patient Care Team: Mast, Man X, NP as PCP - General (Internal Medicine) Eilleen Grates, MD as PCP - Cardiology (Cardiology) Devon Fogo, MD (Inactive) as Consulting Physician (Dermatology)  PLACE OF SERVICE:  [facility]  Advanced Directive information Does Patient Have a Medical Advance Directive?: Yes, Type of Advance Directive: Healthcare Power of [place];Living will;Out of facility DNR (pink MOST or yellow form), Does patient want to make changes to medical advance directive?: No - Patient declined  Allergies  Allergen Reactions   Codeine Hives, Itching and Swelling    headache   Penicillins Hives, Itching and Swelling    headache    Chief Complaint  Patient presents with   Follow-up    Patient is requesting to meet new doctor.     Discussed the use of AI scribe software for clinical note transcription with the patient, who gave verbal consent to proceed.  History of Present Illness   Michael Pace "Marijean Shouts" is a 78 year old male with aortic root dilatation who presents for gradual worsening of shortness of breath and chronic cough.  He has experienced increased shortness of breath over the past several months . He has difficulty breathing after short walks and sometimes experiences relief at the end of a walk. He recently followed with cardiology. He is able to speak in full sentences.  He has a long-standing history of chronic cough, described as 'lots of coughing' with 'lots of sputum' for the past seventy years. The cough has been slightly worse recently, particularly in the spring. No recent fever, but he reports nasal congestion and uses a saline nasal spray and Allegra for relief. He occasionally uses Mucinex for the cough.  He experiences difficulty sleeping flat and uses an adjustable bed to elevate his head, which helps with breathing. He occasionally wakes up at night but no longer experiences frequent episodes  of waking up gasping for air.    He walks for about thirty minutes most days. He reports a persistent unpleasant smell in his apartment bathroom, which he suspects may be affecting his breathing.    Review of Systems:  Review of Systems  Constitutional:  Negative for chills and fever.  HENT:  Negative for congestion and sore throat.   Eyes:  Negative for double vision.  Respiratory:  Positive for cough (chronic). Negative for sputum production and shortness of breath (exertion, chronic, since 4-5 months).   Cardiovascular:  Negative for chest pain, palpitations and leg swelling.  Gastrointestinal:  Negative for abdominal pain, heartburn and nausea.  Genitourinary:  Negative for dysuria, frequency and hematuria.  Musculoskeletal:  Negative for falls and myalgias.  Neurological:  Negative for dizziness, sensory change and focal weakness.   Negative unless indicated in HPI.   Past Medical History:  Diagnosis Date   Arthritis    BPH (benign prostatic hyperplasia)    Bradycardia    Cataracts, bilateral    Dilated aortic root (HCC)    Dyspnea    Dysrhythmia    Family history of adverse reaction to anesthesia    Dad slow to wake up   GERD (gastroesophageal reflux disease)    Headache    Hypertension    Melanoma (HCC) 1988   LEFT ABDOMEN [place]- [place]    Nodular infiltrative basal cell carcinoma (BCC) 05/10/2021   Left Forearm - posterior   Nonrheumatic mitral valve regurgitation    Sleep apnea    Squamous cell carcinoma  of skin 03/29/2016   RIGHT SIDEBURN TX= CX3 5FU   Past Surgical History:  Procedure Laterality Date   CATARACT EXTRACTION, BILATERAL     REVERSE SHOULDER ARTHROPLASTY Right 09/13/2022   Procedure: REVERSE SHOULDER ARTHROPLASTY;  Surgeon: Sammye Cristal, MD;  Location: [facility];  Service: Orthopedics;  Laterality: Right;   REVERSE SHOULDER ARTHROPLASTY Left 06/13/2023   Procedure: REVERSE SHOULDER ARTHROPLASTY;  Surgeon: Sammye Cristal, MD;  Location: [facility];   Service: Orthopedics;  Laterality: Left;   Scrotal surgery     SHOULDER ARTHROSCOPY Right 10/04/2023   SKIN CANCER EXCISION     Melanoma   TONSILLECTOMY     TOTAL KNEE ARTHROPLASTY     bilateral   Social History:   reports that he has never smoked. He has never used smokeless tobacco. He reports current alcohol  use. He reports that he does not use drugs.  Family History  Problem Relation Age of Onset   Cancer Mother        No primary    Medications: Patient's Medications  New Prescriptions   No medications on file  Previous Medications   AMLODIPINE (NORVASC) 5 MG TABLET    Take 5 mg by mouth daily.   ASPIRIN EC 81 MG TABLET    Take 81 mg by mouth daily. Swallow whole.   AZELASTINE (ASTELIN) 0.1 % NASAL SPRAY    Place 1 spray into both nostrils daily as needed for rhinitis or allergies. Use in each nostril as directed   BENAZEPRIL (LOTENSIN) 20 MG TABLET    Take 20 mg by mouth daily.   CARVEDILOL (COREG) 12.5 MG TABLET    Take 12.5 mg by mouth 2 (two) times daily.   FEXOFENADINE (ALLEGRA) 180 MG TABLET    Take 180 mg by mouth daily as needed for allergies or rhinitis.   LUTEIN PO    Take 25 mg by mouth daily.   MULTIPLE VITAMIN (MULTIVITAMIN WITH MINERALS) TABS TABLET    Take 1 tablet by mouth daily.   PSYLLIUM (REGULOID) 0.52 G CAPSULE    Take 1.04 g by mouth See admin instructions. Take 1.04 g once daily, may increase to twice daily as needed for constipation   ROSUVASTATIN (CRESTOR) 5 MG TABLET    Take 5 mg by mouth daily.  Modified Medications   No medications on file  Discontinued Medications   No medications on file    Physical Exam: Vitals:   12/27/23 0915  BP: 108/72  Pulse: 63  Resp: 17  Temp: 97.7 F (36.5 C)  SpO2: 95%  Weight: 257 lb 12.8 oz (116.9 kg)  Height: 5\' 10"  (1.778 m)   Body mass index is 36.99 kg/m. BP Readings from Last 3 Encounters:  12/27/23 108/72  12/12/23 132/84  08/16/23 122/80   Wt Readings from Last 3 Encounters:  12/27/23 257  lb 12.8 oz (116.9 kg)  12/12/23 255 lb (115.7 kg)  08/16/23 252 lb (114.3 kg)    Physical Exam Constitutional:      Appearance: Normal appearance.  HENT:     Head: Normocephalic and atraumatic.  Cardiovascular:     Rate and Rhythm: Normal rate and regular rhythm.     Pulses: Normal pulses.     Heart sounds: Normal heart sounds.  Pulmonary:     Effort: No respiratory distress.     Breath sounds: No stridor. No wheezing or rales.  Abdominal:     General: Bowel sounds are normal. There is no distension.     Palpations: Abdomen  is soft.     Tenderness: There is no abdominal tenderness. There is no guarding.  Musculoskeletal:        General: No swelling.  Neurological:     Mental Status: He is alert. Mental status is at baseline.     Motor: No weakness.     Labs reviewed: Basic Metabolic Panel: Recent Labs    06/07/23 1037 12/17/23 0747  NA 138 140  K 4.9 4.0  CL 106 106  CO2 24 26  GLUCOSE 107* 104*  BUN 16 21  CREATININE 0.75 0.82  CALCIUM 9.2 9.2  TSH  --  2.35   Liver Function Tests: Recent Labs    12/17/23 0747  AST 20  ALT 21  BILITOT 0.6  PROT 6.4   No results for input(s): "LIPASE", "AMYLASE" in the last 8760 hours. No results for input(s): "AMMONIA" in the last 8760 hours. CBC: Recent Labs    06/07/23 1037 12/17/23 0747  WBC 7.3 6.4  NEUTROABS  --  4,198  HGB 15.0 15.4  HCT 47.0 47.8  MCV 94.9 93.7  PLT 168 162   Lipid Panel: Recent Labs    12/17/23 0747  CHOL 181  HDL 61  LDLCALC 99  TRIG 109  CHOLHDL 3.0   TSH: Recent Labs    12/17/23 0747  TSH 2.35   A1C: No results found for: "HGBA1C"  Assessment and Plan Assessment & Plan   1. SOB (shortness of breath) (Primary) Euvolemic on exam Pt able to speak in full sentences Does not appear to be in distress Pt has appt with cardiology and scheduled for repeat Echo  2. Chronic cough Chronic, no recent change Pt reports doing PFT in the past , will need records Denies  fevers, night sweats, weight loss Reports seasonal allergies Instructed to use flonase, claritin   3. Primary hypertension At goal  Denies feeling dizzy or lightheaded  4. Mixed hyperlipidemia Cont with crestor

## 2023-12-27 NOTE — Patient Instructions (Addendum)
 Appointments always at [facility]. Phone Number to call to set up 4 month follow up [phone]

## 2023-12-31 ENCOUNTER — Encounter: Payer: Self-pay | Admitting: Sports Medicine

## 2024-01-01 ENCOUNTER — Ambulatory Visit (HOSPITAL_COMMUNITY)
Admission: RE | Admit: 2024-01-01 | Discharge: 2024-01-01 | Disposition: A | Discharge status: Home / Self Care | Payer: Medicare Other | Source: Ambulatory Visit | Attending: Cardiology | Admitting: Cardiology

## 2024-01-01 DIAGNOSIS — I77810 Thoracic aortic ectasia: Secondary | ICD-10-CM

## 2024-01-01 DIAGNOSIS — I351 Nonrheumatic aortic (valve) insufficiency: Secondary | ICD-10-CM | POA: Diagnosis not present

## 2024-01-01 LAB — ECHOCARDIOGRAM COMPLETE
Area-P 1/2: 3.21 cm2
P 1/2 time: 702 ms
S' Lateral: 3.5 cm

## 2024-01-03 ENCOUNTER — Encounter: Admitting: Sports Medicine

## 2024-01-09 ENCOUNTER — Ambulatory Visit: Payer: Self-pay | Admitting: Cardiology

## 2024-01-10 NOTE — Telephone Encounter (Signed)
 Discussed TEE with Dr. Lavonne Prairie and it was decided that the patient should come in for an appointment to get set up for TEE. Appt made 6/27. Pt and wife (per DPR) aware. Pt and wife verbalized understanding. All questions if any were answered.

## 2024-01-15 NOTE — H&P (View-Only) (Signed)
 Cardiology Clinic Note   Patient Name: Michael Pace Date of Encounter: 01/17/2024  Primary Care Provider:  Mast, Man X, NP Primary Cardiologist:  Lynwood Schilling, MD  Patient Profile    Michael Pace 47 presents to the clinic today for follow-up evaluation of his bradycardia shortness of breath.  Past Medical History    Past Medical History:  Diagnosis Date   Arthritis    BPH (benign prostatic hyperplasia)    Bradycardia    Cataracts, bilateral    Dilated aortic root (HCC)    Dyspnea    Dysrhythmia    Family history of adverse reaction to anesthesia    Dad slow to wake up   GERD (gastroesophageal reflux disease)    Headache    Hypertension    Melanoma (HCC) 1988   LEFT ABDOMEN [place]- [place]    Nodular infiltrative basal cell carcinoma (BCC) 05/10/2021   Left Forearm - posterior   Nonrheumatic mitral valve regurgitation    Sleep apnea    Squamous cell carcinoma of skin 03/29/2016   RIGHT SIDEBURN TX= CX3 5FU   Past Surgical History:  Procedure Laterality Date   CATARACT EXTRACTION, BILATERAL     REVERSE SHOULDER ARTHROPLASTY Right 09/13/2022   Procedure: REVERSE SHOULDER ARTHROPLASTY;  Surgeon: Dozier Soulier, MD;  Location: [facility];  Service: Orthopedics;  Laterality: Right;   REVERSE SHOULDER ARTHROPLASTY Left 06/13/2023   Procedure: REVERSE SHOULDER ARTHROPLASTY;  Surgeon: Dozier Soulier, MD;  Location: [facility];  Service: Orthopedics;  Laterality: Left;   Scrotal surgery     SHOULDER ARTHROSCOPY Right 10/04/2023   SKIN CANCER EXCISION     Melanoma   TONSILLECTOMY     TOTAL KNEE ARTHROPLASTY     bilateral    Allergies  Allergies  Allergen Reactions   Codeine Hives, Itching and Swelling    headache   Penicillins Hives, Itching and Swelling    headache    History of Present Illness    Michael Pace is a PMH of dilated aortic root, mitral valve regurgitation, shortness of breath and bradycardia, and tachycardia.  He moved from  [place].  He was initially seen by Dr. Schilling for evaluation of his palpitations.  He also noted dyspnea.  His beta-blocker had been decreased.  He underwent echocardiogram which showed mild/moderate atrial valve regurgitation and mild aortic insufficiency.  His EF was noted to be normal.  His BNP and TSH were also normal.  His records from [facility] where reviewed.  They showed a stress test from 2007 that did not indicate ischemia.  Echocardiogram 2006 and 2016 showed normal EF.  He was noted to have mild coronary artery disease in 2002.  He had SVT on EP study in 2009.  No pathway for inducible arrhythmias were noted.  He was seen in follow-up by Dr. Schilling on 08/16/2023.  During that time he reported that he had undergone his second shoulder surgery in 1 year.  He had not been as active.  He had done some physical therapy.  He had moved into a retirement home.  He had gained some weight.  He was noted to have some lower extremity swelling which was attributed to increased sodium intake and decreased mobility.  He did note some more dyspnea with exertion he denied PND and orthopnea.  He denied palpitations, presyncope and syncope.  He denied chest pain.  His medication regimen was continued.  Follow-up in 1 year was planned.  Echocardiogram 01/01/2024 showed prolapse of the posterior leaflet with eccentric anterior jet.  It was difficult to assess severity of mitral valve inflow velocities.  Recommendation for TEE was made if worsening symptoms were noted.  EF was noted to be 65-70%, G2 DD  He contacted the cardiology clinic on 01/10/2024.  TEE was discussed.  It was felt that it would be appropriate to proceed with TEE after being discussed with Dr. Lavona.  He presents to the clinic today for follow-up evaluation and states he has not had any chest pain.  He does note shortness of breath with increased physical activity.  We reviewed his echocardiogram from June.  We reviewed TEE.  He expressed  understanding.  He agrees to proceed with test.  Will plan follow-up after testing.SABRA  He denies chest pain,  lower extremity edema, melena, hematuria, hemoptysis, diaphoresis, weakness, presyncope, syncope, orthopnea, and PND.    Home Medications    Prior to Admission medications   Medication Sig Start Date End Date Taking? Authorizing Provider  amLODipine (NORVASC) 5 MG tablet Take 5 mg by mouth daily. 10/06/19   [provider]  aspirin EC 81 MG tablet Take 81 mg by mouth daily. Swallow whole.    [provider]  azelastine (ASTELIN) 0.1 % nasal spray Place 1 spray into both nostrils daily as needed for rhinitis or allergies. Use in each nostril as directed    [provider]  benazepril (LOTENSIN) 20 MG tablet Take 20 mg by mouth daily. 10/06/19   [provider]  carvedilol (COREG) 12.5 MG tablet Take 12.5 mg by mouth 2 (two) times daily. 11/19/19   [provider]  fexofenadine (ALLEGRA) 180 MG tablet Take 180 mg by mouth daily as needed for allergies or rhinitis.    [provider]  LUTEIN PO Take 25 mg by mouth daily.    [provider]  Multiple Vitamin (MULTIVITAMIN WITH MINERALS) TABS tablet Take 1 tablet by mouth daily.    [provider]  psyllium (REGULOID) 0.52 g capsule Take 1.04 g by mouth See admin instructions. Take 1.04 g once daily, may increase to twice daily as needed for constipation    [provider]  rosuvastatin (CRESTOR) 5 MG tablet Take 5 mg by mouth daily. 08/16/22   [provider]    Family History    Family History  Problem Relation Age of Onset   Cancer Mother        No primary   He indicated that his mother is deceased. He indicated that his father is deceased. He indicated that his brother is alive.  Social History    Social History   Socioeconomic History   Marital status: Married    Spouse name: Not on file   Number of children: Not on file   Years of  education: Not on file   Highest education level: Not on file  Occupational History   Not on file  Tobacco Use   Smoking status: Never   Smokeless tobacco: Never  Vaping Use   Vaping status: Never Used  Substance and Sexual Activity   Alcohol  use: Yes    Comment: 2 drinks a week   Drug use: Never   Sexual activity: Not on file  Other Topics Concern   Not on file  Social History Narrative   Lives with wife.  Retired.  No children.     Social Drivers of Corporate investment banker Strain: Not on file  Food Insecurity: Not on file  Transportation Needs: Not on file  Physical Activity: Not  on file  Stress: Not on file  Social Connections: Not on file  Intimate Partner Violence: Not on file     Review of Systems    General:  No chills, fever, night sweats or weight changes.  Cardiovascular:  No chest pain, dyspnea on exertion, edema, orthopnea, palpitations, paroxysmal nocturnal dyspnea. Dermatological: No rash, lesions/masses Respiratory: No cough, dyspnea Urologic: No hematuria, dysuria Abdominal:   No nausea, vomiting, diarrhea, bright red blood per rectum, melena, or hematemesis Neurologic:  No visual changes, wkns, changes in mental status. All other systems reviewed and are otherwise negative except as noted above.  Physical Exam    VS:  BP 128/82 (BP Location: Right Arm, Patient Position: Sitting, Cuff Size: Large)   Pulse 60   Ht 5' 10 (1.778 m)   Wt 255 lb 6.4 oz (115.8 kg)   SpO2 96%   BMI 36.65 kg/m  , BMI Body mass index is 36.65 kg/m. GEN: Well nourished, well developed, in no acute distress. HEENT: normal. Neck: Supple, no JVD, carotid bruits, or masses. Cardiac: RRR, no murmurs, rubs, or gallops. No clubbing, cyanosis, edema.  Radials/DP/PT 2+ and equal bilaterally.  Respiratory:  Respirations regular and unlabored, clear to auscultation bilaterally. GI: Soft, nontender, nondistended, BS + x 4. MS: no deformity or atrophy. Skin: warm and dry, no  rash. Neuro:  Strength and sensation are intact. Psych: Normal affect.  Accessory Clinical Findings    Recent Labs: 12/17/2023: ALT 21; BUN 21; Creat 0.82; Hemoglobin 15.4; Platelets 162; Potassium 4.0; Sodium 140; TSH 2.35   Recent Lipid Panel    Component Value Date/Time   CHOL 181 12/17/2023 0747   TRIG 109 12/17/2023 0747   HDL 61 12/17/2023 0747   CHOLHDL 3.0 12/17/2023 0747   LDLCALC 99 12/17/2023 0747         ECG personally reviewed by me today- EKG Interpretation Date/Time:  Friday January 17 2024 08:06:21 EDT Ventricular Rate:  60 PR Interval:  170 QRS Duration:  92 QT Interval:  404 QTC Calculation: 404 R Axis:   -45  Text Interpretation: Normal sinus rhythm Left axis deviation Confirmed by Emelia Hazy [phone]) on 01/17/2024 8:09:07 AM   Echocardiogram 01/01/2024  IMPRESSIONS   1. Prolapse of the posterior leaflet with eccentric, anterior jet. Difficult to assess severity, mitral valve inflow velocities have worsened since previous interrogation. If worsening symptoms TEE may be reasonable. The mitral valve is normal in structure. Moderate to severe mitral valve regurgitation. No evidence of mitral stenosis. There is mild prolapse of of the mitral valve. 2. Left ventricular ejection fraction, by estimation, is 65 to 70%. Left ventricular ejection fraction by 3D volume is 70 %. The left ventricle has normal function. The left ventricle has no regional wall motion abnormalities. There is mild concentric left ventricular hypertrophy. Left ventricular diastolic parameters are consistent with Grade II diastolic dysfunction (pseudonormalization). The average left ventricular global longitudinal strain is -20.0 %. The global longitudinal strain is normal. 3. Right ventricular systolic function is normal. The right ventricular size is normal. 4. The aortic valve is normal in structure. Aortic valve regurgitation is mild. No aortic stenosis is present. 5. Aortic dilatation  noted. There is mild dilatation of the ascending aorta, measuring 42 mm. 6. The inferior vena cava is normal in size with greater than 50% respiratory variability, suggesting right atrial pressure of 3 mmHg.  FINDINGS Left Ventricle: Left ventricular ejection fraction, by estimation, is 65 to 70%. Left ventricular ejection fraction by 3D volume is 70 %.  The left ventricle has normal function. The left ventricle has no regional wall motion abnormalities. The average left ventricular global longitudinal strain is -20.0 %. Strain was performed and the global longitudinal strain is normal. The left ventricular internal cavity size was normal in size. There is mild concentric left ventricular hypertrophy. Left ventricular diastolic parameters are consistent with Grade II diastolic dysfunction (pseudonormalization).  Right Ventricle: The right ventricular size is normal. No increase in right ventricular wall thickness. Right ventricular systolic function is normal.  Left Atrium: Left atrial size was normal in size.  Right Atrium: Right atrial size was normal in size.  Pericardium: There is no evidence of pericardial effusion.  Mitral Valve: Prolapse of the posterior leaflet with eccentric, anterior jet. Difficult to assess severity, mitral valve inflow velocities have worsened since previous interrogation. If worsening symptoms TEE may be reasonable. The mitral valve is normal in structure. There is mild prolapse of of the mitral valve. Moderate to severe mitral valve regurgitation. No evidence of mitral valve stenosis.  Tricuspid Valve: The tricuspid valve is normal in structure. Tricuspid valve regurgitation is trivial. No evidence of tricuspid stenosis.  Aortic Valve: The aortic valve is normal in structure. Aortic valve regurgitation is mild. Aortic regurgitation PHT measures 702 msec. No aortic stenosis is present.  Pulmonic Valve: The pulmonic valve was normal in structure. Pulmonic valve  regurgitation is mild. No evidence of pulmonic stenosis.  Aorta: Aortic dilatation noted. There is mild dilatation of the ascending aorta, measuring 42 mm.  Venous: The inferior vena cava is normal in size with greater than 50% respiratory variability, suggesting right atrial pressure of 3 mmHg.  IAS/Shunts: No atrial level shunt detected by color flow Doppler.     Assessment & Plan   1.  Mitral valve regurgitation-does note some increased DOE.  Not as active since moving into retirement facility. Continue heart healthy low-sodium diet Ordered TEE Increase physical activity as tolerated  Informed Consent   Shared Decision Making/Informed Consent   The risks [esophageal damage, perforation (1:10,000 risk), bleeding, pharyngeal hematoma as well as other potential complications associated with conscious sedation including aspiration, arrhythmia, respiratory failure and death], benefits (treatment guidance and diagnostic support) and alternatives of a transesophageal echocardiogram were discussed in detail with Michael Pace and he is willing to proceed.      Bradycardia-EKG today shows sinus rhythm 60 bpm.   Continue to monitor Continue carvedilol Coronary artery disease-denies recent episodes of chest discomfort or anginal type symptoms.  Noted to have coronary disease per outside records from 2002. Heart healthy low-sodium diet Increase physical activity as tolerated  Hyperlipidemia-LDL 99 on 12/17/2023. High-fiber diet Continue aspirin, rosuvastatin Follows with PCP  Dilated aortic root-noted to be 42 mm on echocardiogram 6/25 Maintain good blood pressure control Continue amlodipine, carvedilol, benazepril Order TEE  Disposition: Follow-up with Dr. Lavona or me after testing.   Josefa HERO. Aahil Fredin NP-C     01/17/2024, 8:31 AM [facility] [address] Office [phone] Fax [phone]    I spent 14 minutes examining this  patient, reviewing medications, and using patient centered shared decision making involving their cardiac care.   I spent  20 minutes reviewing past medical history,  medications, and prior cardiac tests.

## 2024-01-15 NOTE — Progress Notes (Signed)
 Cardiology Clinic Note   Patient Name: Michael Pace Date of Encounter: 01/17/2024  Primary Care Provider:  Mast, Man X, NP Primary Cardiologist:  Lynwood Schilling, MD  Patient Profile    Michael Pace 47 presents to the clinic today for follow-up evaluation of his bradycardia shortness of breath.  Past Medical History    Past Medical History:  Diagnosis Date   Arthritis    BPH (benign prostatic hyperplasia)    Bradycardia    Cataracts, bilateral    Dilated aortic root (HCC)    Dyspnea    Dysrhythmia    Family history of adverse reaction to anesthesia    Dad slow to wake up   GERD (gastroesophageal reflux disease)    Headache    Hypertension    Melanoma (HCC) 1988   LEFT ABDOMEN [place]- [place]    Nodular infiltrative basal cell carcinoma (BCC) 05/10/2021   Left Forearm - posterior   Nonrheumatic mitral valve regurgitation    Sleep apnea    Squamous cell carcinoma of skin 03/29/2016   RIGHT SIDEBURN TX= CX3 5FU   Past Surgical History:  Procedure Laterality Date   CATARACT EXTRACTION, BILATERAL     REVERSE SHOULDER ARTHROPLASTY Right 09/13/2022   Procedure: REVERSE SHOULDER ARTHROPLASTY;  Surgeon: Dozier Soulier, MD;  Location: [facility];  Service: Orthopedics;  Laterality: Right;   REVERSE SHOULDER ARTHROPLASTY Left 06/13/2023   Procedure: REVERSE SHOULDER ARTHROPLASTY;  Surgeon: Dozier Soulier, MD;  Location: [facility];  Service: Orthopedics;  Laterality: Left;   Scrotal surgery     SHOULDER ARTHROSCOPY Right 10/04/2023   SKIN CANCER EXCISION     Melanoma   TONSILLECTOMY     TOTAL KNEE ARTHROPLASTY     bilateral    Allergies  Allergies  Allergen Reactions   Codeine Hives, Itching and Swelling    headache   Penicillins Hives, Itching and Swelling    headache    History of Present Illness    Michael Pace is a PMH of dilated aortic root, mitral valve regurgitation, shortness of breath and bradycardia, and tachycardia.  He moved from  [place].  He was initially seen by Dr. Schilling for evaluation of his palpitations.  He also noted dyspnea.  His beta-blocker had been decreased.  He underwent echocardiogram which showed mild/moderate atrial valve regurgitation and mild aortic insufficiency.  His EF was noted to be normal.  His BNP and TSH were also normal.  His records from [facility] where reviewed.  They showed a stress test from 2007 that did not indicate ischemia.  Echocardiogram 2006 and 2016 showed normal EF.  He was noted to have mild coronary artery disease in 2002.  He had SVT on EP study in 2009.  No pathway for inducible arrhythmias were noted.  He was seen in follow-up by Dr. Schilling on 08/16/2023.  During that time he reported that he had undergone his second shoulder surgery in 1 year.  He had not been as active.  He had done some physical therapy.  He had moved into a retirement home.  He had gained some weight.  He was noted to have some lower extremity swelling which was attributed to increased sodium intake and decreased mobility.  He did note some more dyspnea with exertion he denied PND and orthopnea.  He denied palpitations, presyncope and syncope.  He denied chest pain.  His medication regimen was continued.  Follow-up in 1 year was planned.  Echocardiogram 01/01/2024 showed prolapse of the posterior leaflet with eccentric anterior jet.  It was difficult to assess severity of mitral valve inflow velocities.  Recommendation for TEE was made if worsening symptoms were noted.  EF was noted to be 65-70%, G2 DD  He contacted the cardiology clinic on 01/10/2024.  TEE was discussed.  It was felt that it would be appropriate to proceed with TEE after being discussed with Dr. Lavona.  He presents to the clinic today for follow-up evaluation and states he has not had any chest pain.  He does note shortness of breath with increased physical activity.  We reviewed his echocardiogram from June.  We reviewed TEE.  He expressed  understanding.  He agrees to proceed with test.  Will plan follow-up after testing.SABRA  He denies chest pain,  lower extremity edema, melena, hematuria, hemoptysis, diaphoresis, weakness, presyncope, syncope, orthopnea, and PND.    Home Medications    Prior to Admission medications   Medication Sig Start Date End Date Taking? Authorizing Provider  amLODipine (NORVASC) 5 MG tablet Take 5 mg by mouth daily. 10/06/19   [provider]  aspirin EC 81 MG tablet Take 81 mg by mouth daily. Swallow whole.    [provider]  azelastine (ASTELIN) 0.1 % nasal spray Place 1 spray into both nostrils daily as needed for rhinitis or allergies. Use in each nostril as directed    [provider]  benazepril (LOTENSIN) 20 MG tablet Take 20 mg by mouth daily. 10/06/19   [provider]  carvedilol (COREG) 12.5 MG tablet Take 12.5 mg by mouth 2 (two) times daily. 11/19/19   [provider]  fexofenadine (ALLEGRA) 180 MG tablet Take 180 mg by mouth daily as needed for allergies or rhinitis.    [provider]  LUTEIN PO Take 25 mg by mouth daily.    [provider]  Multiple Vitamin (MULTIVITAMIN WITH MINERALS) TABS tablet Take 1 tablet by mouth daily.    [provider]  psyllium (REGULOID) 0.52 g capsule Take 1.04 g by mouth See admin instructions. Take 1.04 g once daily, may increase to twice daily as needed for constipation    [provider]  rosuvastatin (CRESTOR) 5 MG tablet Take 5 mg by mouth daily. 08/16/22   [provider]    Family History    Family History  Problem Relation Age of Onset   Cancer Mother        No primary   He indicated that his mother is deceased. He indicated that his father is deceased. He indicated that his brother is alive.  Social History    Social History   Socioeconomic History   Marital status: Married    Spouse name: Not on file   Number of children: Not on file   Years of  education: Not on file   Highest education level: Not on file  Occupational History   Not on file  Tobacco Use   Smoking status: Never   Smokeless tobacco: Never  Vaping Use   Vaping status: Never Used  Substance and Sexual Activity   Alcohol  use: Yes    Comment: 2 drinks a week   Drug use: Never   Sexual activity: Not on file  Other Topics Concern   Not on file  Social History Narrative   Lives with wife.  Retired.  No children.     Social Drivers of Corporate investment banker Strain: Not on file  Food Insecurity: Not on file  Transportation Needs: Not on file  Physical Activity: Not  on file  Stress: Not on file  Social Connections: Not on file  Intimate Partner Violence: Not on file     Review of Systems    General:  No chills, fever, night sweats or weight changes.  Cardiovascular:  No chest pain, dyspnea on exertion, edema, orthopnea, palpitations, paroxysmal nocturnal dyspnea. Dermatological: No rash, lesions/masses Respiratory: No cough, dyspnea Urologic: No hematuria, dysuria Abdominal:   No nausea, vomiting, diarrhea, bright red blood per rectum, melena, or hematemesis Neurologic:  No visual changes, wkns, changes in mental status. All other systems reviewed and are otherwise negative except as noted above.  Physical Exam    VS:  BP 128/82 (BP Location: Right Arm, Patient Position: Sitting, Cuff Size: Large)   Pulse 60   Ht 5' 10 (1.778 m)   Wt 255 lb 6.4 oz (115.8 kg)   SpO2 96%   BMI 36.65 kg/m  , BMI Body mass index is 36.65 kg/m. GEN: Well nourished, well developed, in no acute distress. HEENT: normal. Neck: Supple, no JVD, carotid bruits, or masses. Cardiac: RRR, no murmurs, rubs, or gallops. No clubbing, cyanosis, edema.  Radials/DP/PT 2+ and equal bilaterally.  Respiratory:  Respirations regular and unlabored, clear to auscultation bilaterally. GI: Soft, nontender, nondistended, BS + x 4. MS: no deformity or atrophy. Skin: warm and dry, no  rash. Neuro:  Strength and sensation are intact. Psych: Normal affect.  Accessory Clinical Findings    Recent Labs: 12/17/2023: ALT 21; BUN 21; Creat 0.82; Hemoglobin 15.4; Platelets 162; Potassium 4.0; Sodium 140; TSH 2.35   Recent Lipid Panel    Component Value Date/Time   CHOL 181 12/17/2023 0747   TRIG 109 12/17/2023 0747   HDL 61 12/17/2023 0747   CHOLHDL 3.0 12/17/2023 0747   LDLCALC 99 12/17/2023 0747         ECG personally reviewed by me today- EKG Interpretation Date/Time:  Friday January 17 2024 08:06:21 EDT Ventricular Rate:  60 PR Interval:  170 QRS Duration:  92 QT Interval:  404 QTC Calculation: 404 R Axis:   -45  Text Interpretation: Normal sinus rhythm Left axis deviation Confirmed by Emelia Hazy [phone]) on 01/17/2024 8:09:07 AM   Echocardiogram 01/01/2024  IMPRESSIONS   1. Prolapse of the posterior leaflet with eccentric, anterior jet. Difficult to assess severity, mitral valve inflow velocities have worsened since previous interrogation. If worsening symptoms TEE may be reasonable. The mitral valve is normal in structure. Moderate to severe mitral valve regurgitation. No evidence of mitral stenosis. There is mild prolapse of of the mitral valve. 2. Left ventricular ejection fraction, by estimation, is 65 to 70%. Left ventricular ejection fraction by 3D volume is 70 %. The left ventricle has normal function. The left ventricle has no regional wall motion abnormalities. There is mild concentric left ventricular hypertrophy. Left ventricular diastolic parameters are consistent with Grade II diastolic dysfunction (pseudonormalization). The average left ventricular global longitudinal strain is -20.0 %. The global longitudinal strain is normal. 3. Right ventricular systolic function is normal. The right ventricular size is normal. 4. The aortic valve is normal in structure. Aortic valve regurgitation is mild. No aortic stenosis is present. 5. Aortic dilatation  noted. There is mild dilatation of the ascending aorta, measuring 42 mm. 6. The inferior vena cava is normal in size with greater than 50% respiratory variability, suggesting right atrial pressure of 3 mmHg.  FINDINGS Left Ventricle: Left ventricular ejection fraction, by estimation, is 65 to 70%. Left ventricular ejection fraction by 3D volume is 70 %.  The left ventricle has normal function. The left ventricle has no regional wall motion abnormalities. The average left ventricular global longitudinal strain is -20.0 %. Strain was performed and the global longitudinal strain is normal. The left ventricular internal cavity size was normal in size. There is mild concentric left ventricular hypertrophy. Left ventricular diastolic parameters are consistent with Grade II diastolic dysfunction (pseudonormalization).  Right Ventricle: The right ventricular size is normal. No increase in right ventricular wall thickness. Right ventricular systolic function is normal.  Left Atrium: Left atrial size was normal in size.  Right Atrium: Right atrial size was normal in size.  Pericardium: There is no evidence of pericardial effusion.  Mitral Valve: Prolapse of the posterior leaflet with eccentric, anterior jet. Difficult to assess severity, mitral valve inflow velocities have worsened since previous interrogation. If worsening symptoms TEE may be reasonable. The mitral valve is normal in structure. There is mild prolapse of of the mitral valve. Moderate to severe mitral valve regurgitation. No evidence of mitral valve stenosis.  Tricuspid Valve: The tricuspid valve is normal in structure. Tricuspid valve regurgitation is trivial. No evidence of tricuspid stenosis.  Aortic Valve: The aortic valve is normal in structure. Aortic valve regurgitation is mild. Aortic regurgitation PHT measures 702 msec. No aortic stenosis is present.  Pulmonic Valve: The pulmonic valve was normal in structure. Pulmonic valve  regurgitation is mild. No evidence of pulmonic stenosis.  Aorta: Aortic dilatation noted. There is mild dilatation of the ascending aorta, measuring 42 mm.  Venous: The inferior vena cava is normal in size with greater than 50% respiratory variability, suggesting right atrial pressure of 3 mmHg.  IAS/Shunts: No atrial level shunt detected by color flow Doppler.     Assessment & Plan   1.  Mitral valve regurgitation-does note some increased DOE.  Not as active since moving into retirement facility. Continue heart healthy low-sodium diet Ordered TEE Increase physical activity as tolerated  Informed Consent   Shared Decision Making/Informed Consent   The risks [esophageal damage, perforation (1:10,000 risk), bleeding, pharyngeal hematoma as well as other potential complications associated with conscious sedation including aspiration, arrhythmia, respiratory failure and death], benefits (treatment guidance and diagnostic support) and alternatives of a transesophageal echocardiogram were discussed in detail with Michael Pace and he is willing to proceed.      Bradycardia-EKG today shows sinus rhythm 60 bpm.   Continue to monitor Continue carvedilol Coronary artery disease-denies recent episodes of chest discomfort or anginal type symptoms.  Noted to have coronary disease per outside records from 2002. Heart healthy low-sodium diet Increase physical activity as tolerated  Hyperlipidemia-LDL 99 on 12/17/2023. High-fiber diet Continue aspirin, rosuvastatin Follows with PCP  Dilated aortic root-noted to be 42 mm on echocardiogram 6/25 Maintain good blood pressure control Continue amlodipine, carvedilol, benazepril Order TEE  Disposition: Follow-up with Dr. Lavona or me after testing.   Michael HERO. Aahil Fredin NP-C     01/17/2024, 8:31 AM [facility] [address] Office [phone] Fax [phone]    I spent 14 minutes examining this  patient, reviewing medications, and using patient centered shared decision making involving their cardiac care.   I spent  20 minutes reviewing past medical history,  medications, and prior cardiac tests.

## 2024-01-17 ENCOUNTER — Ambulatory Visit: Attending: General Practice | Admitting: General Practice

## 2024-01-17 ENCOUNTER — Encounter: Payer: Self-pay | Admitting: General Practice

## 2024-01-17 VITALS — BP 128/82 | HR 60 | Ht 70.0 in | Wt 255.4 lb

## 2024-01-17 DIAGNOSIS — I340 Nonrheumatic mitral (valve) insufficiency: Secondary | ICD-10-CM | POA: Insufficient documentation

## 2024-01-17 DIAGNOSIS — I341 Nonrheumatic mitral (valve) prolapse: Secondary | ICD-10-CM | POA: Diagnosis present

## 2024-01-17 DIAGNOSIS — I77810 Thoracic aortic ectasia: Secondary | ICD-10-CM | POA: Insufficient documentation

## 2024-01-17 DIAGNOSIS — R001 Bradycardia, unspecified: Secondary | ICD-10-CM | POA: Diagnosis present

## 2024-01-17 DIAGNOSIS — E782 Mixed hyperlipidemia: Secondary | ICD-10-CM | POA: Insufficient documentation

## 2024-01-17 NOTE — Patient Instructions (Addendum)
 Medication Instructions:  NO CHANGES *If you need a refill on your cardiac medications before your next appointment, please call your pharmacy*  Lab Work: NO LABS If you have labs (blood work) drawn today and your tests are completely normal, you will receive your results only by: MyChart Message (if you have MyChart) OR A paper copy in the mail If you have any lab test that is abnormal or we need to change your treatment, we will call you to review the results.  Testing/Procedures:   Follow-Up: At [facility], you and your health needs are our priority.  As part of our continuing mission to provide you with exceptional heart care, our providers are all part of one team.  This team includes your primary Cardiologist (physician) and Advanced Practice Providers or APPs (Physician Assistants and Nurse Practitioners) who all work together to provide you with the care you need, when you need it.  Your next appointment:   1-2 month(s) AFTER TEE  Provider:   Lynwood Schilling, MD or Josefa Beauvais, NP      Dear Michael Pace  You are scheduled for a TEE (Transesophageal Echocardiogram) on Monday, July 14 with Dr. Shlomo.  Please arrive at the [facility] (Main Entrance A) at [facility]: [address] [place], [place] [address] at 7:30 AM (This time is 1.5 hour(s) before your procedure to ensure your preparation).   Free valet parking service is available. You will check in at ADMITTING.   *Please Note: You will receive a call the day before your procedure to confirm the appointment time. That time may have changed from the original time based on the schedule for that day.*   DIET:  Nothing to eat or drink after midnight except a sip of water  with medications (see medication instructions below)  ** You may take your morning medications with sips of water .  MEDICATION INSTRUCTIONS: !!IF ANY NEW MEDICATIONS ARE STARTED AFTER TODAY, PLEASE NOTIFY YOUR PROVIDER AS SOON AS  POSSIBLE!!  FYI: Medications such as Semaglutide (Ozempic, [place]), Tirzepatide (Mounjaro, Zepbound), Dulaglutide (Trulicity), etc (GLP1 agonists) AND Canagliflozin (Invokana), Dapagliflozin (Farxiga), Empagliflozin (Jardiance), Ertugliflozin (Steglatro), Bexagliflozin [employer]) or any combination with one of these drugs such as Invokamet (Canagliflozin/Metformin), Synjardy (Empagliflozin/Metformin), etc (SGLT2 inhibitors) must be held around the time of a procedure. This is not a comprehensive list of all of these drugs. Please review all of your medications and talk to your provider if you take any one of these. If you are not sure, ask your provider.    LABS:  You will have your labs drawn at the hospital the morning of your procedure   FYI:  For your safety, and to allow us  to monitor your vital signs accurately during the surgery/procedure we request: If you have artificial nails, gel coating, SNS etc, please have those removed prior to your surgery/procedure. Not having the nail coverings /polish removed may result in cancellation or delay of your surgery/procedure.  Your support person will be asked to wait in the waiting room during your procedure.  It is OK to have someone drop you off and come back when you are ready to be discharged.  You cannot drive after the procedure and will need someone to drive you home.  Bring your insurance cards.  *Special Note: Every effort is made to have your procedure done on time. Occasionally there are emergencies that occur at the hospital that may cause delays. Please be patient if a delay does occur.

## 2024-01-31 NOTE — Progress Notes (Signed)
 Spoke to patient and instructed them to come at 800  and to be NPO after 0000.     Confirmed that patient will have a ride home and someone to stay with them for 24 hours after the procedure.

## 2024-02-02 NOTE — Anesthesia Preprocedure Evaluation (Signed)
 Anesthesia Evaluation  Patient identified by MRN, date of birth, ID band Patient awake    Reviewed: Allergy & Precautions, NPO status , Patient's Chart, lab work & pertinent test results, reviewed documented beta blocker date and time   History of Anesthesia Complications Negative for: history of anesthetic complications  Airway Mallampati: II  TM Distance: >3 FB Neck ROM: Full    Dental  (+) Missing,    Pulmonary sleep apnea    Pulmonary exam normal        Cardiovascular hypertension, Pt. on medications and Pt. on home beta blockers Normal cardiovascular exam  TTE 01/01/24: EF 65-70%, mild LVH, grade 2 DD, prolapse of the posterior MV leaflet with eccentric anterior jet, moderate to severe MR, mild dilatation of ascending  aorta measuring 42mm   Neuro/Psych  Headaches    GI/Hepatic Neg liver ROS,GERD  ,,  Endo/Other  negative endocrine ROS    Renal/GU negative Renal ROS     Musculoskeletal  (+) Arthritis ,    Abdominal   Peds  Hematology negative hematology ROS (+)   Anesthesia Other Findings Day of surgery medications reviewed with patient.  Reproductive/Obstetrics                              Anesthesia Physical Anesthesia Plan  ASA: 3  Anesthesia Plan: MAC   Post-op Pain Management: Minimal or no pain anticipated   Induction:   PONV Risk Score and Plan: Treatment may vary due to age or medical condition and Propofol  infusion  Airway Management Planned: Natural Airway and Nasal Cannula  Additional Equipment: None  Intra-op Plan:   Post-operative Plan:   Informed Consent: I have reviewed the patients History and Physical, chart, labs and discussed the procedure including the risks, benefits and alternatives for the proposed anesthesia with the patient or authorized representative who has indicated his/her understanding and acceptance.       Plan Discussed with:  CRNA  Anesthesia Plan Comments:          Anesthesia Quick Evaluation

## 2024-02-03 ENCOUNTER — Ambulatory Visit (HOSPITAL_COMMUNITY): Payer: Self-pay | Admitting: Anesthesiology

## 2024-02-03 ENCOUNTER — Encounter (HOSPITAL_COMMUNITY): Payer: Self-pay | Admitting: Cardiology

## 2024-02-03 ENCOUNTER — Ambulatory Visit (HOSPITAL_BASED_OUTPATIENT_CLINIC_OR_DEPARTMENT_OTHER): Payer: Self-pay | Admitting: Anesthesiology

## 2024-02-03 ENCOUNTER — Other Ambulatory Visit: Payer: Self-pay

## 2024-02-03 ENCOUNTER — Encounter (HOSPITAL_COMMUNITY)
Admission: RE | Disposition: A | Discharge status: Home / Self Care | Payer: Self-pay | Source: Home / Self Care | Attending: Cardiology

## 2024-02-03 ENCOUNTER — Ambulatory Visit (HOSPITAL_COMMUNITY)
Admission: RE | Admit: 2024-02-03 | Discharge: 2024-02-03 | Disposition: A | Discharge status: Home / Self Care | Source: Ambulatory Visit | Attending: Cardiology | Admitting: Cardiology

## 2024-02-03 ENCOUNTER — Ambulatory Visit: Payer: Self-pay | Admitting: Cardiology

## 2024-02-03 ENCOUNTER — Ambulatory Visit (HOSPITAL_COMMUNITY)
Admission: RE | Admit: 2024-02-03 | Discharge: 2024-02-03 | Disposition: A | Discharge status: Home / Self Care | Attending: Cardiology | Admitting: Cardiology

## 2024-02-03 DIAGNOSIS — I77819 Aortic ectasia, unspecified site: Secondary | ICD-10-CM | POA: Diagnosis not present

## 2024-02-03 DIAGNOSIS — I340 Nonrheumatic mitral (valve) insufficiency: Secondary | ICD-10-CM

## 2024-02-03 DIAGNOSIS — I341 Nonrheumatic mitral (valve) prolapse: Secondary | ICD-10-CM | POA: Diagnosis not present

## 2024-02-03 DIAGNOSIS — I10 Essential (primary) hypertension: Secondary | ICD-10-CM | POA: Insufficient documentation

## 2024-02-03 DIAGNOSIS — I080 Rheumatic disorders of both mitral and aortic valves: Secondary | ICD-10-CM | POA: Diagnosis not present

## 2024-02-03 DIAGNOSIS — G4733 Obstructive sleep apnea (adult) (pediatric): Secondary | ICD-10-CM

## 2024-02-03 DIAGNOSIS — I2510 Atherosclerotic heart disease of native coronary artery without angina pectoris: Secondary | ICD-10-CM | POA: Insufficient documentation

## 2024-02-03 DIAGNOSIS — R001 Bradycardia, unspecified: Secondary | ICD-10-CM | POA: Diagnosis not present

## 2024-02-03 DIAGNOSIS — E785 Hyperlipidemia, unspecified: Secondary | ICD-10-CM | POA: Insufficient documentation

## 2024-02-03 DIAGNOSIS — Z7982 Long term (current) use of aspirin: Secondary | ICD-10-CM | POA: Insufficient documentation

## 2024-02-03 DIAGNOSIS — Z79899 Other long term (current) drug therapy: Secondary | ICD-10-CM | POA: Diagnosis not present

## 2024-02-03 HISTORY — PX: TRANSESOPHAGEAL ECHOCARDIOGRAM (CATH LAB): EP1270

## 2024-02-03 LAB — POCT I-STAT, CHEM 8
BUN: 21 mg/dL (ref 8–23)
Calcium, Ion: 1.24 mmol/L (ref 1.15–1.40)
Chloride: 105 mmol/L (ref 98–111)
Creatinine, Ser: 0.9 mg/dL (ref 0.61–1.24)
Glucose, Bld: 111 mg/dL — ABNORMAL HIGH (ref 70–99)
HCT: 44 % (ref 39.0–52.0)
Hemoglobin: 15 g/dL (ref 13.0–17.0)
Potassium: 3.9 mmol/L (ref 3.5–5.1)
Sodium: 141 mmol/L (ref 135–145)
TCO2: 22 mmol/L (ref 22–32)

## 2024-02-03 SURGERY — TRANSESOPHAGEAL ECHOCARDIOGRAM (TEE) (CATHLAB)
Anesthesia: Monitor Anesthesia Care

## 2024-02-03 MED ORDER — PROPOFOL 500 MG/50ML IV EMUL
INTRAVENOUS | Status: DC | PRN
  Administered 2024-02-03: 125 ug/kg/min via INTRAVENOUS
  Administered 2024-02-03: 30 mg via INTRAVENOUS
  Administered 2024-02-03: 25 mg via INTRAVENOUS

## 2024-02-03 MED ORDER — SODIUM CHLORIDE 0.9 % IV SOLN: INTRAVENOUS | Status: DC

## 2024-02-03 MED ORDER — LIDOCAINE 2% (20 MG/ML) 5 ML SYRINGE
INTRAMUSCULAR | Status: DC | PRN
  Administered 2024-02-03: 60 mg via INTRAVENOUS

## 2024-02-03 NOTE — CV Procedure (Addendum)
     PROCEDURE NOTE:  Procedure:  Transesophageal echocardiogram Operator:  Wilbert Bihari, MD Indications:  Mitral Regurgitation Complications: None  During this procedure the patient is administered a total of Propofol  338 mg and Lidocaine  60 mg to achieve and maintain moderate conscious sedation.  The patient's heart rate, blood pressure, and oxygen saturation are monitored continuously during the procedure. The period of conscious sedation is 20 minutes, of which I was present face-to-face 100% of this time. Bruno Leisure, CRNA is an independent, trained observer who assisted in the monitoring of the patient's level of consciousness.    Results: Normal LV size and function Normal RV size and function Normal RA Normal LA and LA appendage with no thrombus Normal TV Normal PV Myxomatous MV with mild mitral valve prolapse of the P2 component of the posterior leaflet with mild to moderate MR eccentrically directed anteriorly towards the septal wall.  Trileaflet AV with aortic valve sclerosis with no stenosis and mild AI Lipomatous interatrial septum with no evidence of shunt by colorflow dopper  Normal thoracic and ascending aorta.  The patient tolerated the procedure well and was transferred back to their room in stable condition.  Signed: Wilbert Bihari, MD [facility]

## 2024-02-03 NOTE — Anesthesia Procedure Notes (Signed)
 Procedure Name: MAC Date/Time: 02/03/2024 9:02 AM  Performed by: Bess Josette ORN, CRNAPre-anesthesia Checklist: Patient identified, Emergency Drugs available, Suction available and Patient being monitored Patient Re-evaluated:Patient Re-evaluated prior to induction Oxygen Delivery Method: Nasal cannula Induction Type: IV induction Airway Equipment and Method: Retrograde intubation technique Placement Confirmation: positive ETCO2 Dental Injury: Teeth and Oropharynx as per pre-operative assessment

## 2024-02-03 NOTE — Anesthesia Postprocedure Evaluation (Signed)
 Anesthesia Post Note  Patient: Michael Pace  Procedure(s) Performed: TRANSESOPHAGEAL ECHOCARDIOGRAM     Patient location during evaluation: PACU Anesthesia Type: MAC Level of consciousness: awake and alert Pain management: pain level controlled Vital Signs Assessment: post-procedure vital signs reviewed and stable Respiratory status: spontaneous breathing, nonlabored ventilation and respiratory function stable Cardiovascular status: blood pressure returned to baseline Postop Assessment: no apparent nausea or vomiting Anesthetic complications: no   No notable events documented.  Last Vitals:  Vitals:   02/03/24 0955 02/03/24 1000  BP: 125/89 130/82  Pulse: 60 60  Resp: 14 (!) 21  Temp:    SpO2: 96% 92%    Last Pain:  Vitals:   02/03/24 0935  TempSrc:   PainSc: 0-No pain                 Vertell Row

## 2024-02-03 NOTE — Transfer of Care (Signed)
 Immediate Anesthesia Transfer of Care Note  Patient: Michael Pace  Procedure(s) Performed: TRANSESOPHAGEAL ECHOCARDIOGRAM  Patient Location: PACU and Cath Lab  Anesthesia Type:MAC  Level of Consciousness: awake  Airway & Oxygen Therapy: Patient connected to nasal cannula oxygen  Post-op Assessment: Report given to RN  Post vital signs: stable  Last Vitals:  Vitals Value Taken Time  BP    Temp    Pulse    Resp    SpO2      Last Pain:  Vitals:   02/03/24 0820  TempSrc:   PainSc: 0-No pain         Complications: No notable events documented.

## 2024-02-03 NOTE — Interval H&P Note (Signed)
 History and Physical Interval Note:  02/03/2024 8:40 AM  Michael Pace  has presented today for surgery, with the diagnosis of MITRAL VALVE PROLAPSE.  The various methods of treatment have been discussed with the patient and family. After consideration of risks, benefits and other options for treatment, the patient has consented to  Procedure(s): TRANSESOPHAGEAL ECHOCARDIOGRAM (N/A) as a surgical intervention.  The patient's history has been reviewed, patient examined, no change in status, stable for surgery.  I have reviewed the patient's chart and labs.  Questions were answered to the patient's satisfaction.     Wilbert Bihari

## 2024-02-10 LAB — ECHO TEE

## 2024-03-04 ENCOUNTER — Telehealth: Payer: Self-pay | Admitting: Cardiology

## 2024-03-04 NOTE — Telephone Encounter (Signed)
 FYI

## 2024-03-04 NOTE — Telephone Encounter (Signed)
   Name: Michael Pace  DOB: Jul 28, 1945  MRN: [id number]  Primary Cardiologist: Lynwood Schilling, MD  Chart reviewed as part of pre-operative protocol coverage. The patient has an upcoming visit scheduled with Dr. Schilling  on 03/12/2024 at which time clearance can be addressed in case there are any issues that would impact surgical recommendations.   Left Hydrocelectomy  Is not scheduled until 03/17/2024 as below. I added preop FYI to appointment note so that provider is aware to address at time of outpatient visit.  Per office protocol the cardiology provider should forward their finalized clearance decision and recommendations regarding antiplatelet therapy to the requesting party below.     I will route this message as FYI to requesting party and remove this message from the preop box as separate preop APP input not needed at this time.   Please call with any questions.  Lamarr Satterfield, NP  03/04/2024, 12:09 PM

## 2024-03-04 NOTE — Telephone Encounter (Signed)
   Pre-operative Risk Assessment    Patient Name: Michael Pace  DOB: 12-27-1945 MRN: [id number]   Date of last office visit: 01/17/24 Date of next office visit: 03/12/24   Request for Surgical Clearance    Procedure:  Left Hydrocelectomy    Date of Surgery:  Clearance 03/17/24                                Surgeon:  Dr. Avonne  Surgeon's Group or Practice Name:  [facility]  Phone number:  [phone] Fax number:  [phone]    Type of Clearance Requested:   - Medical  - Pharmacy:  Hold Aspirin     Type of Anesthesia:  General    Additional requests/questions:    Bonney Sheffield JONELLE Lenora   03/04/2024, 11:49 AM

## 2024-03-11 DIAGNOSIS — Z01810 Encounter for preprocedural cardiovascular examination: Secondary | ICD-10-CM | POA: Insufficient documentation

## 2024-03-11 NOTE — Progress Notes (Unsigned)
 Cardiology Office Note:   Date:  03/12/2024  ID:  Michael Pace, DOB Oct 31, 1945, MRN [id number] PCP: Mast, Man X, NP  [facility] Providers Cardiologist:  Lynwood Schilling, MD {  History of Present Illness:   Michael Pace is a 78 y.o. male who was referred by Jama Manuelita RAMAN, NP for evaluation of bradycardia and SOB.    He moved from [place].  He has a history of tachycardia.   He saw me because of palpitations.  He also had dyspnea.  He had his beta blocker decreased.    Echo demonstrated mild/mod MR and mild AI.  His EF was normal.  BNP and TSH were normal.  Records from [facility] demonstrated a perfusion study in 2007 with no ischemia.  Echocardiogram in 2006 and again in 2016 demonstrated a normal ejection fraction.  There is a report of mild coronary disease in 2002.  He had an SVT and an EP study in 2009.  No pathway was identified with no inducible arrhythmias.   Echocardiogram 01/01/2024 showed prolapse of the posterior leaflet with eccentric anterior jet. It was difficult to assess severity of mitral valve inflow velocities. Recommendation for TEE was made if worsening symptoms were noted. EF was noted to be 65-70%, G2 DD .   TEE demonstrated the MR to mild to moderate.     He says he has had shoulder surgery and since then he has been getting increased dyspnea with exertion.  He says that he gets short of breath walking a short distance of a level ground.  He is not having PND or orthopnea.  He is not having any palpitations, presyncope or syncope.  He denies any chest pressure, neck or arm discomfort.  He is due to have a cyst removed from his scrotum.  ROS: As stated in the HPI and negative for all other systems.  Studies Reviewed:    EKG:   EKG Interpretation Date/Time:  Thursday March 12 2024 15:42:46 EDT Ventricular Rate:  64 PR Interval:  174 QRS Duration:  88 QT Interval:  396 QTC Calculation: 408 R Axis:   -56  Text Interpretation: Normal sinus rhythm  Left axis deviation Inferior infarct (cited on or before 12-Mar-2024) Possible Anterior infarct (cited on or before 12-Mar-2024) When compared with ECG of 17-Jan-2024 08:06, No significant change since last tracing Confirmed by Schilling Lynwood ([id number]) on 03/12/2024 4:00:10 PMNA  Risk Assessment/Calculations:      Physical Exam:   VS:  BP 118/79   Pulse 64   Ht 5' 10 (1.778 m)   Wt 254 lb (115.2 kg)   SpO2 94%   BMI 36.45 kg/m    Wt Readings from Last 3 Encounters:  03/12/24 254 lb (115.2 kg)  02/03/24 250 lb (113.4 kg)  01/17/24 255 lb 6.4 oz (115.8 kg)     GEN: Well nourished, well developed in no acute distress NECK: No JVD; No carotid bruits CARDIAC: RRR, 2 out of 6 apical systolic murmur radiating to the axilla, no diastolic murmurs, rubs, gallops RESPIRATORY:  Clear to auscultation without rales, wheezing or rhonchi  ABDOMEN: Soft, non-tender, non-distended EXTREMITIES:  No edema; No deformity   ASSESSMENT AND PLAN:   BRADYCARDIA: Given his dyspnea on exertion I am to check a 3-day ZIO to just to make sure he does not have any significant bradycardic rhythms and check and see if he has chronotropic competence.  I do not have a strong suspicion that this is related to his dyspnea but I will screen.  MR:   This was moderate on echo in June 2024.  I will check a BNP given his progressive dyspnea.  However, given the fact that this does not look severe on TEE unlikely to follow with a transthoracic next year.  AORTIC ROOT :   In January 2024 his echo was 40 mm.  I will follow-up with imaging 2026   PREOP; the patient is going to have a low risk procedure done with resection of the scrotal cyst.  There is no cardiovascular contraindication.  No further testing is indicated prior to this.  SOB: This has been progressive.  I am going to check a BNP.  I will check the event monitor as above.  I am concerned because he had recent immobility with a shoulder surgery and dyspnea since  that time.  I am going to rule out pulmonary emboli with a CT.  Further workup will be pending this.       Follow up with me in 2 months.   Signed, Lynwood Schilling, MD

## 2024-03-12 ENCOUNTER — Ambulatory Visit: Attending: Cardiology | Admitting: Cardiology

## 2024-03-12 ENCOUNTER — Encounter: Payer: Self-pay | Admitting: Cardiology

## 2024-03-12 ENCOUNTER — Ambulatory Visit

## 2024-03-12 VITALS — BP 118/79 | HR 64 | Ht 70.0 in | Wt 254.0 lb

## 2024-03-12 DIAGNOSIS — I340 Nonrheumatic mitral (valve) insufficiency: Secondary | ICD-10-CM | POA: Insufficient documentation

## 2024-03-12 DIAGNOSIS — R001 Bradycardia, unspecified: Secondary | ICD-10-CM | POA: Insufficient documentation

## 2024-03-12 DIAGNOSIS — R0602 Shortness of breath: Secondary | ICD-10-CM | POA: Diagnosis present

## 2024-03-12 DIAGNOSIS — I77810 Thoracic aortic ectasia: Secondary | ICD-10-CM | POA: Insufficient documentation

## 2024-03-12 DIAGNOSIS — Z01810 Encounter for preprocedural cardiovascular examination: Secondary | ICD-10-CM | POA: Diagnosis present

## 2024-03-12 DIAGNOSIS — Z01812 Encounter for preprocedural laboratory examination: Secondary | ICD-10-CM | POA: Insufficient documentation

## 2024-03-12 NOTE — Progress Notes (Unsigned)
 Enrolled patient for a 3 day Zio XT monitor to be mailed to patients home

## 2024-03-12 NOTE — Patient Instructions (Addendum)
 Medication Instructions:  Your physician recommends that you continue on your current medications as directed. Please refer to the Current Medication list given to you today.  *If you need a refill on your cardiac medications before your next appointment, please call your pharmacy*  Lab Work: BMET, BNP today If you have labs (blood work) drawn today and your tests are completely normal, you will receive your results only by: MyChart Message (if you have MyChart) OR A paper copy in the mail If you have any lab test that is abnormal or we need to change your treatment, we will call you to review the results.  Testing/Procedures: CT of Lung  3 Day Zio Heart Monitor Your physician has requested that you wear a Zio heart monitor for ___3__ days. This will be mailed to your home with instructions on how to apply the monitor and how to return it when finished. Please allow 2 weeks after returning the heart monitor before our office calls you with the results.   Follow-Up: At [facility], you and your health needs are our priority.  As part of our continuing mission to provide you with exceptional heart care, our providers are all part of one team.  This team includes your primary Cardiologist (physician) and Advanced Practice Providers or APPs (Physician Assistants and Nurse Practitioners) who all work together to provide you with the care you need, when you need it.  Your next appointment:   2 months  Provider:   Lavona, MD  We recommend signing up for the patient portal called MyChart.  Sign up information is provided on this After Visit Summary.  MyChart is used to connect with patients for Virtual Visits (Telemedicine).  Patients are able to view lab/test results, encounter notes, upcoming appointments, etc.  Non-urgent messages can be sent to your provider as well.   To learn more about what you can do with MyChart, go to [email or web address].   Other Instructions ZIO  XT- Long Term Monitor Instructions  Your physician has requested you wear a ZIO patch monitor for 3 days.  This is a single patch monitor. [employer] supplies one patch monitor per enrollment. Additional stickers are not available. Please do not apply patch if you will be having a Nuclear Stress Test,  Echocardiogram, Cardiac CT, MRI, or Chest Xray during the period you would be wearing the  monitor. The patch cannot be worn during these tests. You cannot remove and re-apply the  ZIO XT patch monitor.  Your ZIO patch monitor will be mailed 3 day USPS to your address on file. It may take 3-5 days  to receive your monitor after you have been enrolled.  Once you have received your monitor, please review the enclosed instructions. Your monitor  has already been registered assigning a specific monitor serial # to you.  Billing and Patient Assistance Program Information  We have supplied [employer] with any of your insurance information on file for billing purposes. [employer] offers a sliding scale Patient Assistance Program for patients that do not have  insurance, or whose insurance does not completely cover the cost of the ZIO monitor.  You must apply for the Patient Assistance Program to qualify for this discounted rate.  To apply, please call [employer] at [phone], select option 4, select option 2, ask to apply for  Patient Assistance Program. Meredeth will ask your household income, and how many people  are in your household. They will quote your out-of-pocket cost based on that information.  [employer]  will also be able to set up a 76-month, interest-free payment plan if needed.  Applying the monitor   Shave hair from upper left chest.  Hold abrader disc by orange tab. Rub abrader in 40 strokes over the upper left chest as  indicated in your monitor instructions.  Clean area with 4 enclosed alcohol  pads. Let dry.  Apply patch as indicated in monitor instructions. Patch will be placed under  collarbone on left  side of chest with arrow pointing upward.  Rub patch adhesive wings for 2 minutes. Remove white label marked 1. Remove the white  label marked 2. Rub patch adhesive wings for 2 additional minutes.  While looking in a mirror, press and release button in center of patch. A small green light will  flash 3-4 times. This will be your only indicator that the monitor has been turned on.  Do not shower for the first 24 hours. You may shower after the first 24 hours.  Press the button if you feel a symptom. You will hear a small click. Record Date, Time and  Symptom in the Patient Logbook.  When you are ready to remove the patch, follow instructions on the last 2 pages of Patient  Logbook. Stick patch monitor onto the last page of Patient Logbook.  Place Patient Logbook in the blue and white box. Use locking tab on box and tape box closed  securely. The blue and white box has prepaid postage on it. Please place it in the mailbox as  soon as possible. Your physician should have your test results approximately 7 days after the  monitor has been mailed back to [facility].  Call [facility] Customer Care at [phone] if you have questions regarding  your ZIO XT patch monitor. Call them immediately if you see an orange light blinking on your  monitor.  If your monitor falls off in less than 4 days, contact our Monitor department at [phone].  If your monitor becomes loose or falls off after 4 days call [employer] at [phone] for  suggestions on securing your monitor

## 2024-03-14 LAB — BASIC METABOLIC PANEL WITH GFR
BUN/Creatinine Ratio: 21 (ref 10–24)
BUN: 21 mg/dL (ref 8–27)
CO2: 21 mmol/L (ref 20–29)
Calcium: 9.5 mg/dL (ref 8.6–10.2)
Chloride: 104 mmol/L (ref 96–106)
Creatinine, Ser: 0.99 mg/dL (ref 0.76–1.27)
Glucose: 103 mg/dL — ABNORMAL HIGH (ref 70–99)
Potassium: 5.1 mmol/L (ref 3.5–5.2)
Sodium: 140 mmol/L (ref 134–144)
eGFR: 77 mL/min/1.73 (ref >59)

## 2024-03-14 LAB — PRO B NATRIURETIC PEPTIDE: NT-Pro BNP: <36 pg/mL (ref 0–486)

## 2024-03-15 ENCOUNTER — Ambulatory Visit: Payer: Self-pay | Admitting: Cardiology

## 2024-03-17 HISTORY — PX: CYSTECTOMY: SUR359

## 2024-04-05 DIAGNOSIS — R001 Bradycardia, unspecified: Secondary | ICD-10-CM | POA: Diagnosis not present

## 2024-04-07 ENCOUNTER — Other Ambulatory Visit: Payer: Self-pay

## 2024-04-07 MED ORDER — AMLODIPINE BESYLATE 5 MG PO TABS: 5.0000 mg | ORAL_TABLET | Freq: Every day | ORAL | 1 refills | Status: DC

## 2024-04-07 MED ORDER — BENAZEPRIL HCL 20 MG PO TABS: 20.0000 mg | ORAL_TABLET | Freq: Every day | ORAL | 1 refills | Status: DC

## 2024-04-07 MED ORDER — CARVEDILOL 12.5 MG PO TABS: 12.5000 mg | ORAL_TABLET | Freq: Two times a day (BID) | ORAL | 1 refills | Status: DC

## 2024-05-17 NOTE — Progress Notes (Unsigned)
 Cardiology Office Note:   Date:  05/18/2024  ID:  Cross Michael Pace, DOB 09/01/1945, MRN [id number] PCP: Michael Pace, Michael X, NP  [facility] Providers Cardiologist:  Lynwood Schilling, MD {  History of Present Illness:   Michael Pace is a 78 y.o. male PMH of dilated aortic root, mitral valve regurgitation, shortness of breath and bradycardia, and tachycardia.  He moved from [place].  He has had palpitations.  He also noted dyspnea.  His beta-blocker had been decreased.  He underwent echocardiogram which showed mild/moderate atrial valve regurgitation and mild aortic insufficiency.  His EF was noted to be normal.  His BNP and TSH were also normal.  His records from [facility] where reviewed.  They showed a stress test from 2007 that did not indicate ischemia.  Echocardiogram 2006 and 2016 showed normal EF.  He was noted to have mild coronary artery disease in 2002.  He had SVT on EP study in 2009.  No pathway for inducible arrhythmias were noted.  Echocardiogram 01/01/2024 showed prolapse of the posterior leaflet with eccentric anterior jet.  It was difficult to assess severity of mitral valve inflow velocities.  Recommendation for TEE was made if worsening symptoms were noted.  EF was noted to be 65-70%, G2 DD   TEE in July demonstrated an EF of 60 - 65% with moderate MR.    There was mild AI.    He comes back today and says he is getting more short of breath.  He is dyspneic walking about 50 feet.  He will be short of breath climbing a flight of stairs and have to stop for second at the top.  He is not describing PND or orthopnea.  He is not describing new palpitations, presyncope or syncope.  He has not been walking as much as he was.  He fell and had to recover from this.  He is also gained probably about 10 pounds in the last several months.  ROS: As stated in the HPI and negative for all other systems.  Studies Reviewed:    EKG:     03/12/2024 sinus rhythm, rate 64, possible ectopic atrial  rhythm with unusual P wave axis, poor anterior R wave progression  Risk Assessment/Calculations:     Physical Exam:   VS:  BP 104/70 (BP Location: Left Arm, Patient Position: Sitting, Cuff Size: Large)   Pulse 63   Ht 5' 10 (1.778 m)   Wt 257 lb (116.6 kg)   SpO2 98%   BMI 36.88 kg/m    Wt Readings from Last 3 Encounters:  05/18/24 257 lb (116.6 kg)  03/12/24 254 lb (115.2 kg)  02/03/24 250 lb (113.4 kg)     GEN: Well nourished, well developed in no acute distress NECK: No JVD; No carotid bruits CARDIAC: RRR, no murmurs, rubs, gallops RESPIRATORY:  Clear to auscultation without rales, wheezing or rhonchi  ABDOMEN: Soft, non-tender, non-distended EXTREMITIES:  No edema; No deformity   ASSESSMENT AND PLAN:   BRADYCARDIA: The patient has no symptoms related to this.  Monitor demonstrated no high-grade heart block when he wore this in September.  He looked to have chronotropic competence.   MR:   This was moderate on TEE.  I will follow this up again in 1 year.    AORTIC ROOT :   I will arrange follow-up with this in 2026.   SOB:     This could be weight and deconditioning.  However, I am going to start with pulmonary function tests.  There is no evidence of heart failure or that he is MR is contributing to this.  I will have a low threshold for ischemia workup for even the lower possibility of looking for another pulmonary emboli if he has continued dyspnea on exertion despite weight loss.     Follow up with me in about 3 to 4 months.  Signed, Lynwood Schilling, MD

## 2024-05-18 ENCOUNTER — Ambulatory Visit: Attending: Cardiology | Admitting: Cardiology

## 2024-05-18 ENCOUNTER — Encounter: Payer: Self-pay | Admitting: Cardiology

## 2024-05-18 VITALS — BP 104/70 | HR 63 | Ht 70.0 in | Wt 257.0 lb

## 2024-05-18 DIAGNOSIS — R0602 Shortness of breath: Secondary | ICD-10-CM | POA: Insufficient documentation

## 2024-05-18 DIAGNOSIS — R001 Bradycardia, unspecified: Secondary | ICD-10-CM | POA: Diagnosis present

## 2024-05-18 DIAGNOSIS — I77810 Thoracic aortic ectasia: Secondary | ICD-10-CM | POA: Insufficient documentation

## 2024-05-18 DIAGNOSIS — I340 Nonrheumatic mitral (valve) insufficiency: Secondary | ICD-10-CM | POA: Diagnosis not present

## 2024-05-18 NOTE — Patient Instructions (Signed)
 Medication Instructions:  Your physician recommends that you continue on your current medications as directed. Please refer to the Current Medication list given to you today.  *If you need a refill on your cardiac medications before your next appointment, please call your pharmacy*  Lab Work: NONE If you have labs (blood work) drawn today and your tests are completely normal, you will receive your results only by: MyChart Message (if you have MyChart) OR A paper copy in the mail If you have any lab test that is abnormal or we need to change your treatment, we will call you to review the results.  Testing/Procedures: Pulmonary Function Test   Follow-Up: At [facility], you and your health needs are our priority.  As part of our continuing mission to provide you with exceptional heart care, our providers are all part of one team.  This team includes your primary Cardiologist (physician) and Advanced Practice Providers or APPs (Physician Assistants and Nurse Practitioners) who all work together to provide you with the care you need, when you need it.  Your next appointment:   4 months  Provider:   Lavona, MD  We recommend signing up for the patient portal called MyChart.  Sign up information is provided on this After Visit Summary.  MyChart is used to connect with patients for Virtual Visits (Telemedicine).  Patients are able to view lab/test results, encounter notes, upcoming appointments, etc.  Non-urgent messages can be sent to your provider as well.   To learn more about what you can do with MyChart, go to [email or web address].

## 2024-05-28 ENCOUNTER — Encounter: Payer: Self-pay | Admitting: Nurse Practitioner

## 2024-05-28 ENCOUNTER — Non-Acute Institutional Stay: Payer: Self-pay | Admitting: Nurse Practitioner

## 2024-05-28 VITALS — BP 110/76 | HR 66 | Temp 97.8°F | Resp 20 | Ht 70.0 in | Wt 247.4 lb

## 2024-05-28 DIAGNOSIS — R0602 Shortness of breath: Secondary | ICD-10-CM | POA: Diagnosis not present

## 2024-05-28 DIAGNOSIS — I10 Essential (primary) hypertension: Secondary | ICD-10-CM | POA: Diagnosis not present

## 2024-05-28 MED ORDER — AMLODIPINE BESYLATE 2.5 MG PO TABS: 2.5000 mg | ORAL_TABLET | Freq: Every day | ORAL | 0 refills | Status: AC

## 2024-05-28 NOTE — Progress Notes (Unsigned)
 Location: [facility]     Place of Service:    Provider: Larwance Hark NP  Tavarus Poteete X, NP  Patient Care Team: Latriece Anstine X, NP as PCP - General (Internal Medicine) Lavona Agent, MD as PCP - Cardiology (Cardiology) Livingston Rigg, MD as Consulting Physician (Dermatology)  Extended Emergency Contact Information Primary Emergency Contact: Schlemmer,Eilleen Address: [address]          [place], [place] [address] [place] Home Phone: [phone] Mobile Phone: [phone] Relation: Spouse  Code Status:  DNR Goals of care: Advanced Directive information    05/28/2024    1:42 PM  Advanced Directives  Does Patient Have a Medical Advance Directive? Yes  Type of Estate Agent of [place];Living will  Does patient want to make changes to medical advance directive? No - Patient declined  Copy of Healthcare Power of Attorney in Chart? Yes - validated most recent copy scanned in chart (See row information)     Chief Complaint  Patient presents with   Medical Management of Chronic Issues    4 Month follow up.    HPI:  Pt is a 78 y.o. male seen today for medical management of chronic diseases.    SOB/palpitation/dyspnea, saw Cardiology, had transesophageal echocardiogram, echocardiogram, noted bradycardia/tachycardia, Mitral valve insufficiency, dilated aortic root, Beta blocker has been decreased. Normal echo, BNP, TSH. Hx of SVT 2009. Hx of mild coronary artery disease 2002. No evidence of CHF, pending pulmonary function test.    HTN, on Carvedilol , Amlodipine , Benazepril , ASA, Bun/creat 21/0.99 03/13/24             HLD, on Rosuvastatin             Constipation, Psyllium, diet.              OA, hx of R+L knee replacement, R+L shoulder replacement.              GERD, not taking acid reducer.              Urinary frequency, not new, q2-3 hour at night.    Past Medical History:  Diagnosis Date   Arthritis    BPH (benign prostatic  hyperplasia)    Bradycardia    Cataracts, bilateral    Dilated aortic root    Dyspnea    Dysrhythmia    Family history of adverse reaction to anesthesia    Dad slow to wake up   GERD (gastroesophageal reflux disease)    Hypertension    Melanoma (HCC) 1988   LEFT ABDOMEN [place]- [place]    Nodular infiltrative basal cell carcinoma (BCC) 05/10/2021   Left Forearm - posterior   Nonrheumatic mitral valve regurgitation    Sleep apnea    No CPAP   Squamous cell carcinoma of skin 03/29/2016   RIGHT SIDEBURN TX= CX3 5FU   Past Surgical History:  Procedure Laterality Date   CATARACT EXTRACTION, BILATERAL     CYSTECTOMY  03/17/2024   REVERSE SHOULDER ARTHROPLASTY Right 09/13/2022   Procedure: REVERSE SHOULDER ARTHROPLASTY;  Surgeon: Dozier Soulier, MD;  Location: [facility];  Service: Orthopedics;  Laterality: Right;   REVERSE SHOULDER ARTHROPLASTY Left 06/13/2023   Procedure: REVERSE SHOULDER ARTHROPLASTY;  Surgeon: Dozier Soulier, MD;  Location: [facility];  Service: Orthopedics;  Laterality: Left;   Scrotal surgery     SHOULDER ARTHROSCOPY Right 10/04/2023   SKIN CANCER EXCISION     Melanoma   TONSILLECTOMY     TOTAL KNEE ARTHROPLASTY  bilateral   TRANSESOPHAGEAL ECHOCARDIOGRAM (CATH LAB) N/A 02/03/2024   Procedure: TRANSESOPHAGEAL ECHOCARDIOGRAM;  Surgeon: Shlomo Wilbert SAUNDERS, MD;  Location: [facility] INVASIVE CV LAB;  Service: Cardiovascular;  Laterality: N/A;    Allergies  Allergen Reactions   Celebrex [Celecoxib] Other (See Comments)    CAUSED STOMAC PAINS   Codeine Hives, Itching and Swelling    headache   Penicillins Hives, Itching and Swelling    headache   Tramadol Nausea Only    DIZZINESS    Allergies as of 05/28/2024       Reactions   Celebrex [celecoxib] Other (See Comments)   CAUSED STOMAC PAINS   Codeine Hives, Itching, Swelling   headache   Penicillins Hives, Itching, Swelling   headache   Tramadol Nausea Only   DIZZINESS        Medication List         Accurate as of May 28, 2024 11:59 PM. If you have any questions, ask your nurse or doctor.          STOP taking these medications    ibuprofen 200 MG tablet Commonly known as: ADVIL Stopped by: Deniesha Stenglein X Vernor Monnig   psyllium 0.52 g capsule Commonly known as: REGULOID Stopped by: Selinda Korzeniewski X Kacy Hegna       TAKE these medications    acetaminophen  500 MG tablet Commonly known as: TYLENOL  Take 1,000 mg by mouth every 6 (six) hours as needed for mild pain (pain score 1-3) or moderate pain (pain score 4-6).   amLODipine  2.5 MG tablet Commonly known as: NORVASC  Take 1 tablet (2.5 mg total) by mouth daily. What changed:  medication strength how much to take Changed by: Samera Macy X Trisha Ken   aspirin EC 81 MG tablet Take 81 mg by mouth daily. Swallow whole.   azelastine 0.1 % nasal spray Commonly known as: ASTELIN Place 1 spray into both nostrils daily as needed for rhinitis or allergies. Use in each nostril as directed   benazepril  20 MG tablet Commonly known as: LOTENSIN  Take 1 tablet (20 mg total) by mouth daily.   carvedilol  12.5 MG tablet Commonly known as: COREG  Take 1 tablet (12.5 mg total) by mouth 2 (two) times daily.   fexofenadine 180 MG tablet Commonly known as: ALLEGRA Take 180 mg by mouth daily as needed for allergies or rhinitis.   LUTEIN PO Take 25 mg by mouth daily.   multivitamin with minerals Tabs tablet Take 1 tablet by mouth daily.   rosuvastatin 5 MG tablet Commonly known as: CRESTOR Take 5 mg by mouth daily.        Review of Systems  Constitutional:  Negative for appetite change and fatigue.  HENT:  Positive for postnasal drip. Negative for congestion and trouble swallowing.        Lifelong nose reconstruction surgery 78 year old.   Eyes:  Negative for visual disturbance.  Respiratory:  Positive for shortness of breath. Negative for cough, chest tightness and wheezing.   Cardiovascular:  Negative for leg swelling.  Gastrointestinal:  Negative for abdominal  pain and constipation.  Genitourinary:  Positive for frequency. Negative for dysuria and urgency.  Musculoskeletal:  Positive for arthralgias and back pain.  Neurological:  Negative for weakness and headaches.       Occasional numbness in toes.   Psychiatric/Behavioral:  Negative for sleep disturbance. The patient is not nervous/anxious.     Immunization History  Administered Date(s) Administered   Fluad Quad(high Dose 65+) 03/17/2020   INFLUENZA, HIGH DOSE SEASONAL PF 03/21/2021,  05/13/2023   Influenza-Unspecified 05/07/2024   PFIZER(Purple Top)SARS-COV-2 Vaccination 08/27/2019, 09/22/2019, 05/06/2020, 06/02/2021   Pfizer(Comirnaty)Fall Seasonal Vaccine 12 years and older 04/30/2022   Pneumococcal Conjugate-13 08/01/2021   Pneumococcal Polysaccharide-23 08/01/2021   Tdap 07/26/2015   Unspecified SARS-COV-2 Vaccination 04/27/2024   Zoster Recombinant(Shingrix) 05/24/2020, 09/12/2020   Pertinent  Health Maintenance Due  Topic Date Due   Influenza Vaccine  Completed      06/23/2020    1:43 PM 12/12/2023    2:30 PM 12/27/2023    9:19 AM 12/27/2023    9:49 AM 05/28/2024    1:41 PM  Fall Risk  Falls in the past year? 0 0 0 0 1  Was there an injury with Fall?  0 0 1 1  Fall Risk Category Calculator  0 0 1 2  Patient at Risk for Falls Due to  No Fall Risks No Fall Risks  No Fall Risks  Fall risk Follow up  Falls evaluation completed Falls evaluation completed  Falls evaluation completed   Functional Status Survey:    Vitals:   05/28/24 1343  BP: 110/76  Pulse: 66  Resp: 20  Temp: 97.8 F (36.6 C)  SpO2: 95%  Weight: 247 lb 6.4 oz (112.2 kg)  Height: 5' 10 (1.778 m)   Body mass index is 35.5 kg/m. Physical Exam Vitals and nursing note reviewed.  Constitutional:      Appearance: Normal appearance.  HENT:     Head: Normocephalic and atraumatic.     Nose: Nose normal.     Mouth/Throat:     Mouth: Mucous membranes are moist.  Eyes:     Extraocular Movements: Extraocular  movements intact.     Conjunctiva/sclera: Conjunctivae normal.     Pupils: Pupils are equal, round, and reactive to light.  Cardiovascular:     Rate and Rhythm: Normal rate and regular rhythm.     Heart sounds: No murmur heard. Pulmonary:     Effort: Pulmonary effort is normal.     Breath sounds: No wheezing, rhonchi or rales.  Abdominal:     General: Bowel sounds are normal.     Palpations: Abdomen is soft.     Tenderness: There is no abdominal tenderness.  Musculoskeletal:        General: No tenderness. Normal range of motion.     Cervical back: Normal range of motion and neck supple.     Right lower leg: No edema.     Left lower leg: No edema.  Skin:    General: Skin is warm and dry.     Findings: No rash.  Neurological:     General: No focal deficit present.     Mental Status: He is alert and oriented to person, place, and time. Mental status is at baseline.     Motor: No weakness.     Coordination: Coordination normal.     Gait: Gait abnormal.  Psychiatric:        Mood and Affect: Mood normal.        Behavior: Behavior normal.        Thought Content: Thought content normal.        Judgment: Judgment normal.     Labs reviewed: Recent Labs    06/07/23 1037 12/17/23 0747 02/03/24 0850 03/13/24 0945  NA 138 140 141 140  K 4.9 4.0 3.9 5.1  CL 106 106 105 104  CO2 24 26  --  21  GLUCOSE 107* 104* 111* 103*  BUN 16 21 21  21  CREATININE 0.75 0.82 0.90 0.99  CALCIUM 9.2 9.2  --  9.5   Recent Labs    12/17/23 0747  AST 20  ALT 21  BILITOT 0.6  PROT 6.4   Recent Labs    06/07/23 1037 12/17/23 0747 02/03/24 0850  WBC 7.3 6.4  --   NEUTROABS  --  4,198  --   HGB 15.0 15.4 15.0  HCT 47.0 47.8 44.0  MCV 94.9 93.7  --   PLT 168 162  --    Lab Results  Component Value Date   TSH 2.35 12/17/2023   No results found for: HGBA1C Lab Results  Component Value Date   CHOL 181 12/17/2023   HDL 61 12/17/2023   LDLCALC 99 12/17/2023   TRIG 109 12/17/2023    CHOLHDL 3.0 12/17/2023    Significant Diagnostic Results in last 30 days:  No results found.  Assessment/Plan  SOB (shortness of breath) SOB/palpitation/dyspnea, saw Cardiology, had transesophageal echocardiogram, echocardiogram, noted bradycardia/tachycardia, Mitral valve insufficiency, dilated aortic root, Beta blocker has been decreased. Normal echo, BNP, TSH. Hx of SVT 2009. Hx of mild coronary artery disease 2002. No evidence of CHF, pending pulmonary function test.   HTN (hypertension) Blood pressure runs low, in setting of SOB/fatigue, will try loose Bp control, on Carvedilol , decrease Amlodipine  2.5/5mg  po daily, continue Benazepril , ASA, Bun/creat 21/0.99 03/13/24  HLD (hyperlipidemia)  on Rosuvastatin   Family/ staff Communication: plan of care reviewed with the patient   Labs/tests ordered:  none

## 2024-05-28 NOTE — Assessment & Plan Note (Signed)
 SOB/palpitation/dyspnea, saw Cardiology, had transesophageal echocardiogram, echocardiogram, noted bradycardia/tachycardia, Mitral valve insufficiency, dilated aortic root, Beta blocker has been decreased. Normal echo, BNP, TSH. Hx of SVT 2009. Hx of mild coronary artery disease 2002. No evidence of CHF, pending pulmonary function test.

## 2024-05-28 NOTE — Patient Instructions (Addendum)
 1.Schedule Annual Wellness Visit. 2. F/u in clinic [facility] 4 months.

## 2024-05-28 NOTE — Assessment & Plan Note (Addendum)
 Blood pressure runs low, in setting of SOB/fatigue, will try loose Bp control, on Carvedilol , decrease Amlodipine  2.5/5mg  po daily, continue Benazepril , ASA, Bun/creat 21/0.99 03/13/24

## 2024-05-28 NOTE — Assessment & Plan Note (Signed)
on Rosuvastatin

## 2024-05-29 ENCOUNTER — Encounter: Payer: Self-pay | Admitting: Nurse Practitioner

## 2024-06-03 ENCOUNTER — Ambulatory Visit (HOSPITAL_COMMUNITY)
Admission: RE | Admit: 2024-06-03 | Discharge: 2024-06-03 | Disposition: A | Discharge status: Home / Self Care | Source: Ambulatory Visit | Attending: Cardiology | Admitting: Cardiology

## 2024-06-03 DIAGNOSIS — R0602 Shortness of breath: Secondary | ICD-10-CM | POA: Diagnosis present

## 2024-06-03 LAB — PULMONARY FUNCTION TEST
DL/VA % pred: 126 %
DL/VA: 4.93 ml/min/mmHg/L
DLCO unc % pred: 90 %
DLCO unc: 22.12 ml/min/mmHg
FEF 25-75 Post: 2.34 L/s
FEF 25-75 Pre: 2.16 L/s
FEF2575-%Change-Post: 8 %
FEF2575-%Pred-Post: 118 %
FEF2575-%Pred-Pre: 109 %
FEV1-%Change-Post: 5 %
FEV1-%Pred-Post: 76 %
FEV1-%Pred-Pre: 72 %
FEV1-Post: 2.19 L
FEV1-Pre: 2.08 L
FEV1FVC-%Change-Post: 0 %
FEV1FVC-%Pred-Pre: 114 %
FEV6-%Change-Post: 4 %
FEV6-%Pred-Post: 70 %
FEV6-%Pred-Pre: 67 %
FEV6-Post: 2.66 L
FEV6-Pre: 2.53 L
FEV6FVC-%Pred-Post: 107 %
FEV6FVC-%Pred-Pre: 107 %
FVC-%Change-Post: 4 %
FVC-%Pred-Post: 65 %
FVC-%Pred-Pre: 62 %
FVC-Post: 2.66 L
FVC-Pre: 2.53 L
Post FEV1/FVC ratio: 82 %
Post FEV6/FVC ratio: 100 %
Pre FEV1/FVC ratio: 82 %
Pre FEV6/FVC Ratio: 100 %
RV % pred: 78 %
RV: 2.09 L
TLC % pred: 69 %
TLC: 4.89 L

## 2024-06-03 MED ORDER — ALBUTEROL SULFATE (2.5 MG/3ML) 0.083% IN NEBU
2.5000 mg | INHALATION_SOLUTION | Freq: Once | RESPIRATORY_TRACT | Status: AC
  Administered 2024-06-03: 2.5 mg via RESPIRATORY_TRACT

## 2024-06-06 ENCOUNTER — Ambulatory Visit: Payer: Self-pay | Admitting: Cardiology

## 2024-06-06 DIAGNOSIS — R0602 Shortness of breath: Secondary | ICD-10-CM

## 2024-07-02 ENCOUNTER — Ambulatory Visit (HOSPITAL_COMMUNITY): Admission: RE | Admit: 2024-07-02 | Discharge: 2024-07-02 | Attending: Cardiology | Admitting: Cardiology

## 2024-07-02 DIAGNOSIS — R0602 Shortness of breath: Secondary | ICD-10-CM | POA: Diagnosis present

## 2024-07-02 MED ORDER — IOHEXOL 350 MG/ML SOLN
75.0000 mL | Freq: Once | INTRAVENOUS | Status: AC | PRN
  Administered 2024-07-02: 75 mL via INTRAVENOUS

## 2024-07-08 ENCOUNTER — Encounter (HOSPITAL_BASED_OUTPATIENT_CLINIC_OR_DEPARTMENT_OTHER): Payer: Self-pay | Admitting: Pulmonary Disease

## 2024-07-08 ENCOUNTER — Other Ambulatory Visit (HOSPITAL_BASED_OUTPATIENT_CLINIC_OR_DEPARTMENT_OTHER): Payer: Self-pay

## 2024-07-08 ENCOUNTER — Ambulatory Visit (INDEPENDENT_AMBULATORY_CARE_PROVIDER_SITE_OTHER): Admitting: Pulmonary Disease

## 2024-07-08 VITALS — BP 116/72 | HR 60 | Temp 98.5°F | Ht 70.0 in | Wt 258.7 lb

## 2024-07-08 DIAGNOSIS — R0609 Other forms of dyspnea: Secondary | ICD-10-CM

## 2024-07-08 DIAGNOSIS — R0602 Shortness of breath: Secondary | ICD-10-CM

## 2024-07-08 DIAGNOSIS — G4733 Obstructive sleep apnea (adult) (pediatric): Secondary | ICD-10-CM | POA: Diagnosis not present

## 2024-07-08 DIAGNOSIS — J984 Other disorders of lung: Secondary | ICD-10-CM

## 2024-07-08 MED ORDER — SYMBICORT 160-4.5 MCG/ACT IN AERO: 2.0000 | INHALATION_SPRAY | Freq: Two times a day (BID) | RESPIRATORY_TRACT | 12 refills | Status: AC

## 2024-07-08 NOTE — Progress Notes (Signed)
 Subjective:   PATIENT ID: Michael Pace GENDER: male DOB: 09/04/1945, MRN: [id number]  Chief Complaint  Patient presents with   Consult    Consult for sob    Reason for Visit: New consult for shortness of breath     Michael Pace is a 78 y.o. male never smoker with HTN, mitral valve prolapse, moderate OSA not on oral compliance or CPAP, GERD, osteoarthritis and melanoma s/p resection who presents for evaluation for shortness of breath.  Social History: Retired optician, dispensing     07/08/2024 Discussed the use of AI scribe software for clinical note transcription with the patient, who gave verbal consent to proceed.  History of Present Illness Michael Pace is an 78 year old male with sleep apnea who presents with shortness of breath. He was referred by Dr. Maple for evaluation of his breathing difficulties.  He has experienced significant shortness of breath for the past four months, which has become more noticeable since the summer. He has a long-standing history of breathing difficulties, similar to his father, and recalls having limited exercise capacity since childhood. Recently, he has been unable to complete his usual walks around the [employer], now taking 20-25 minutes to complete a single circuit, which previously took 35-40 minutes for two circuits. He experiences wheezing, particularly on inhalation, and feels fatigued and tired, especially after walking. Even walking short distances on flat surfaces causes him to become breathless.  He has a history of sleep apnea diagnosed about four to five years ago, confirmed by a home sleep study. Initially, he used a dental device for treatment, which he discontinued after it caused dental issues and misalignment of his teeth. He has not used any CPAP device and reports waking up multiple times at night until his retirement, though now he sleeps more solidly for the first four to five hours.  He underwent a  thorough cardiac workup, including breathing tests, which showed a partial response to inhalers. His breathing tests showed a force vital capacity of 2.5 liters, with expected values for his age being 3.1 liters. His recent imaging did not show significant scarring or obstruction, and his gas exchange results were within normal limits.  He has had both shoulders replaced in the last year and a half, and he reports that his breathing issues have worsened since these surgeries. He has gained nearly ten pounds in the past two months and attributes some of his breathing difficulties to deconditioning.     Past Medical History:  Diagnosis Date   Arthritis    BPH (benign prostatic hyperplasia)    Bradycardia    Cataracts, bilateral    Dilated aortic root    Dyspnea    Dysrhythmia    Family history of adverse reaction to anesthesia    Dad slow to wake up   GERD (gastroesophageal reflux disease)    Hypertension    Melanoma (HCC) 1988   LEFT ABDOMEN [place]- [place]    Nodular infiltrative basal cell carcinoma (BCC) 05/10/2021   Left Forearm - posterior   Nonrheumatic mitral valve regurgitation    Sleep apnea    No CPAP   Squamous cell carcinoma of skin 03/29/2016   RIGHT SIDEBURN TX= CX3 5FU     Family History  Problem Relation Age of Onset   Cancer Mother        No primary     Social History   Occupational History   Not on file  Tobacco Use   Smoking status: Never  Passive exposure: Never   Smokeless tobacco: Never  Vaping Use   Vaping status: Never Used  Substance and Sexual Activity   Alcohol  use: Yes    Comment: 2 drinks a week   Drug use: Never   Sexual activity: Not on file    Allergies[1]   Outpatient Medications Prior to Visit  Medication Sig Dispense Refill   acetaminophen  (TYLENOL ) 500 MG tablet Take 1,000 mg by mouth every 6 (six) hours as needed for mild pain (pain score 1-3) or moderate pain (pain score 4-6).     amLODipine  (NORVASC ) 2.5 MG tablet Take 1  tablet (2.5 mg total) by mouth daily. 30 tablet 0   aspirin EC 81 MG tablet Take 81 mg by mouth daily. Swallow whole.     azelastine (ASTELIN) 0.1 % nasal spray Place 1 spray into both nostrils daily as needed for rhinitis or allergies. Use in each nostril as directed     benazepril  (LOTENSIN ) 20 MG tablet Take 1 tablet (20 mg total) by mouth daily. 90 tablet 1   carvedilol  (COREG ) 12.5 MG tablet Take 1 tablet (12.5 mg total) by mouth 2 (two) times daily. 180 tablet 1   fexofenadine (ALLEGRA) 180 MG tablet Take 180 mg by mouth daily as needed for allergies or rhinitis.     LUTEIN PO Take 25 mg by mouth daily.     Multiple Vitamin (MULTIVITAMIN WITH MINERALS) TABS tablet Take 1 tablet by mouth daily.     rosuvastatin (CRESTOR) 5 MG tablet Take 5 mg by mouth daily.     No facility-administered medications prior to visit.    Review of Systems  Constitutional:  Negative for chills, diaphoresis, fever, malaise/fatigue and weight loss.  HENT:  Negative for congestion.   Respiratory:  Positive for shortness of breath and wheezing. Negative for cough, hemoptysis and sputum production.   Cardiovascular:  Negative for chest pain, palpitations and leg swelling.     Objective:   Vitals:   07/08/24 1435  BP: 116/72  Pulse: 60  Temp: 98.5 F (36.9 C)  TempSrc: Oral  SpO2: 96%  Weight: 258 lb 11.2 oz (117.3 kg)  Height: 5' 10 (1.778 m)   SpO2: 96 %  Physical Exam GENERAL: Well appearing, no acute distress HEAD EARS NOSE THROAT: Normocephalic, atraumatic EYES: Extraocular movements intact, no scleral icterus RESPIRATORY: Clear to auscultation bilaterally, no crackles, wheezing or rales, lungs normal CARDIOVASCULAR: Regular rate and rhythm, no murmurs, rubs, gallops, no jugular venous distention EXTREMITIES: No edema, no tenderness NEUROLOGICAL: Alert and oriented x4, cranial nerves II-XII grossly intact PSYCHIATRIC: Normal mood, normal affect   Data Reviewed:  Imaging: CTA 07/02/24  No pulmonary embolism. Scattered tiny pulmonary nodules with 7 mm right lung nodule not seen on prior.   PFT: 06/03/24 FVC 2.66 (65%) FEV1 2.19 (76%) Ratio 82  TLC 69% DLCO 90% Interpretation: Mild restrictive defect. Normal DLCO. Partial but no significant bronchodilator response  Labs: CBC    Component Value Date/Time   WBC 6.4 12/17/2023 0747   RBC 5.10 12/17/2023 0747   HGB 15.0 02/03/2024 0850   HCT 44.0 02/03/2024 0850   PLT 162 12/17/2023 0747   MCV 93.7 12/17/2023 0747   MCH 30.2 12/17/2023 0747   MCHC 32.2 12/17/2023 0747   RDW 12.5 12/17/2023 0747   EOSABS 166 12/17/2023 0747   BASOSABS 32 12/17/2023 0747   Absolute eos  12/17/23 -166  HST 06/23/20 - AHI 20/h without hypoxemia     Assessment & Plan:   Discussion:  HPI Assessment & Plan Chronic dyspnea due to restrictive lung disease and deconditioning Chronic dyspnea for four months, exacerbated by moderate activity. Partial response to inhalers. Lung capacity limited due to restrictive lung disease and deconditioning. No asthma or COPD. CT shows minimal scarring. Possible diaphragm atrophy from past trauma and inactivity. Deconditioning likely from reduced activity due to dyspnea. - Prescribed Symbicort  inhaler, two puffs morning and night. - Referred to pulmonary rehabilitation, twice-weekly for eight weeks. - Encouraged regular exercise to improve conditioning and diaphragm function.  Obstructive sleep apnea, untreated Diagnosed four years ago, untreated due to CPAP and dental device issues. Likely contributing to daytime fatigue and dyspnea. Untreated sleep apnea may increase heart rate and blood pressure. Discussed CPAP benefits for oxygenation and symptom reduction. He expressed reluctance due to past experiences and comfort concerns. - Discussed CPAP therapy as a treatment option. - Offered referral to sleep lab for CPAP fitting and adjustment. Declined - Encouraged consideration of CPAP therapy to improve  sleep quality and reduce daytime symptoms.   Health Maintenance Immunization History  Administered Date(s) Administered   Fluad Quad(high Dose 65+) 03/17/2020   INFLUENZA, HIGH DOSE SEASONAL PF 03/21/2021, 05/13/2023   Influenza-Unspecified 05/07/2024   PFIZER(Purple Top)SARS-COV-2 Vaccination 08/27/2019, 09/22/2019, 05/06/2020, 06/02/2021   Pfizer(Comirnaty)Fall Seasonal Vaccine 12 years and older 04/30/2022   Pneumococcal Conjugate-13 08/01/2021   Pneumococcal Polysaccharide-23 08/01/2021   Tdap 07/26/2015   Unspecified SARS-COV-2 Vaccination 04/27/2024   Zoster Recombinant(Shingrix) 05/24/2020, 09/12/2020   CT Lung Screen - not qualified  Orders Placed This Encounter  Procedures   AMB referral to pulmonary rehabilitation    Referral Priority:   Routine    Referral Type:   Consultation    Number of Visits Requested:   1   Meds ordered this encounter  Medications   SYMBICORT  160-4.5 MCG/ACT inhaler    Sig: Inhale 2 puffs into the lungs 2 (two) times daily.    Dispense:  1 each    Refill:  12    Brand name per insuracne    Return in about 2 months (around 09/08/2024).  I have spent a total time of 45-minutes on the day of the appointment reviewing prior documentation, coordinating care and discussing medical diagnosis and plan with the patient/family. Imaging, labs and tests included in this note have been reviewed and interpreted independently by me. This note is generated using [employer] programming. Patient/family has given consent.  Demone Lyles Slater Staff, MD [facility] 07/08/2024 3:48 PM         [1]  Allergies Allergen Reactions   Celebrex [Celecoxib] Other (See Comments)    CAUSED STOMAC PAINS   Codeine Hives, Itching and Swelling    headache   Penicillins Hives, Itching and Swelling    headache   Tramadol Nausea Only    DIZZINESS

## 2024-07-08 NOTE — Patient Instructions (Addendum)
 Shortness of breath and wheezing --START Symbicort  160-4.5 mcg TWO puffs in the morning and evening. Rinse mouth out after use  OSA, uncontrolled --Please contact our office if you would like to start CPAP

## 2024-07-09 ENCOUNTER — Telehealth (HOSPITAL_COMMUNITY): Payer: Self-pay

## 2024-07-09 NOTE — Telephone Encounter (Signed)
 The patient has been notified of the result and verbalized understanding.  All questions (if any) were answered. Aleck LOISE Bill, RN 07/09/2024 5:09 PM  Results sent to PCP.  Pt stated he has followed up with Dr. Kassie in pulmonary and that she was not concerned about the nodule. Will route to Dr. Lavona regarding chest CT.

## 2024-07-09 NOTE — Telephone Encounter (Signed)
 Called patient to see if he was interested in participating in the Pulmonary Rehab Program. Patient will come in for orientation on 07/27/2024 and will attend the 08/04/2024 exercise class.  Sent letter via my chart

## 2024-07-10 ENCOUNTER — Encounter (HOSPITAL_COMMUNITY): Payer: Self-pay

## 2024-07-24 ENCOUNTER — Telehealth (HOSPITAL_COMMUNITY): Payer: Self-pay

## 2024-07-24 NOTE — Telephone Encounter (Signed)
 Called to confirm appt. Pt confirmed appt. Instructed pt on proper footwear. Gave directions along with department number.

## 2024-07-27 ENCOUNTER — Encounter (HOSPITAL_COMMUNITY): Payer: Self-pay

## 2024-07-27 ENCOUNTER — Encounter (HOSPITAL_COMMUNITY)
Admission: RE | Admit: 2024-07-27 | Discharge: 2024-07-27 | Disposition: A | Discharge status: Home / Self Care | Source: Ambulatory Visit | Attending: Pulmonary Disease | Admitting: Pulmonary Disease

## 2024-07-27 VITALS — BP 124/70 | HR 65 | Wt 258.6 lb

## 2024-07-27 DIAGNOSIS — J984 Other disorders of lung: Secondary | ICD-10-CM | POA: Insufficient documentation

## 2024-07-27 NOTE — Progress Notes (Signed)
 Michael Pace 79 y.o. male Pulmonary Rehab Orientation Note This patient who was referred to Pulmonary Rehab by Dr. Kassie with the diagnosis of Restrictive lung disease arrived today in Cardiac and Pulmonary Rehab. He arrived ambulatory with normal gait. He does not carry portable oxygen. Per patient, Michael Pace uses oxygen never. Color good, skin warm and dry. Patient is oriented to time and place. Patient's medical history, psychosocial health, and medications reviewed. Psychosocial assessment reveals patient lives with spouse. Michael Pace is currently retired. Patient hobbies include listening to music and working jigsaw puzzles. Patient reports his stress level is low. Areas of stress/anxiety include health. Patient does not exhibit signs of depression. PHQ2/9 score 0/2. Michael Pace shows good  coping skills with positive outlook on life. Offered emotional support and reassurance. Will continue to monitor. Physical assessment performed by Nurse pick: Michael Levin RN. Please see their orientation physical assessment note. Michael Pace reports he  does take medications as prescribed. Patient states he  follows a regular  diet. The patient would like to lose weight. Patient's weight will be monitored closely. Demonstration and practice of PLB using pulse oximeter. Michael Pace able to return demonstration satisfactorily. Safety and hand hygiene in the exercise area reviewed with patient. Michael Pace voices understanding of the information reviewed. Department expectations discussed with patient and achievable goals were set. The patient shows enthusiasm about attending the program and we look forward to working with Michael Pace. Michael Pace completed a 6 min walk test today and is scheduled to begin exercise on 08/04/24.   [id number] Michael Pace, BSRT

## 2024-07-27 NOTE — Progress Notes (Signed)
 Pulmonary Individual Treatment Plan  Patient Details  Name: Michael Pace MRN: [id number] Date of Birth: 1946-05-02 Referring Provider:   Flowsheet Row PULMONARY REHAB OTHER RESP ORIENTATION from 07/27/2024 in [facility]  Referring Provider Kassie    Initial Encounter Date:  Flowsheet Row PULMONARY REHAB OTHER RESP ORIENTATION from 07/27/2024 in [facility]  Date 07/27/24    Visit Diagnosis: Restrictive lung disease  Patient's Home Medications on Admission:  Current Medications[1]  Past Medical History: Past Medical History:  Diagnosis Date   Arthritis    BPH (benign prostatic hyperplasia)    Bradycardia    Cataracts, bilateral    Dilated aortic root    Dyspnea    Dysrhythmia    Family history of adverse reaction to anesthesia    Dad slow to wake up   GERD (gastroesophageal reflux disease)    Hypertension    Melanoma (HCC) 1988   LEFT ABDOMEN [place]- [place]    Nodular infiltrative basal cell carcinoma (BCC) 05/10/2021   Left Forearm - posterior   Nonrheumatic mitral valve regurgitation    Sleep apnea    No CPAP   Squamous cell carcinoma of skin 03/29/2016   RIGHT SIDEBURN TX= CX3 5FU    Tobacco Use: Tobacco Use History[2]  Labs: Review Flowsheet       Latest Ref Rng & Units 12/17/2023 02/03/2024  Labs for ITP Cardiac and Pulmonary Rehab  Cholestrol <200 mg/dL 818  -  LDL (calc) mg/dL (calc) 99  -  HDL-C > OR = 40 mg/dL 61  -  Trlycerides <849 mg/dL 890  -  TCO2 22 - 32 mmol/L - 22     Capillary Blood Glucose: No results found for: GLUCAP   Pulmonary Assessment Scores:  Pulmonary Assessment Scores     Row Name 07/27/24 1354         ADL UCSD   ADL Phase Entry     SOB Score total 37       CAT Score   CAT Score 22       mMRC Score   mMRC Score 3       UCSD: Self-administered rating of dyspnea associated with activities of  daily living (ADLs) 6-point scale (0 = not at all to 5 = maximal or unable to do because of breathlessness)  Scoring Scores range from 0 to 120.  Minimally important difference is 5 units  CAT: CAT can identify the health impairment of COPD patients and is better correlated with disease progression.  CAT has a scoring range of zero to 40. The CAT score is classified into four groups of low (less than 10), medium (10 - 20), high (21-30) and very high (31-40) based on the impact level of disease on health status. A CAT score over 10 suggests significant symptoms.  A worsening CAT score could be explained by an exacerbation, poor medication adherence, poor inhaler technique, or progression of COPD or comorbid conditions.  CAT MCID is 2 points  mMRC: mMRC (Modified Medical Research Council) Dyspnea Scale is used to assess the degree of baseline functional disability in patients of respiratory disease due to dyspnea. No minimal important difference is established. A decrease in score of 1 point or greater is considered a positive change.   Pulmonary Function Assessment:  Pulmonary Function Assessment - 07/27/24 1338       Breath   Bilateral Breath Sounds Clear  Shortness of Breath Yes;Limiting activity          Exercise Target Goals: Exercise Program Goal: Individual exercise prescription set using results from initial 6 min walk test and THRR while considering  patients activity barriers and safety.   Exercise Prescription Goal: Initial exercise prescription builds to 30-45 minutes a day of aerobic activity, 2-3 days per week.  Home exercise guidelines will be given to patient during program as part of exercise prescription that the participant will acknowledge.  Activity Barriers & Risk Stratification:  Activity Barriers & Cardiac Risk Stratification - 07/27/24 1335       Activity Barriers & Cardiac Risk Stratification   Activity Barriers Deconditioning;Muscular  Weakness;Shortness of Breath;Left Knee Replacement;Right Knee Replacement;History of Falls    Cardiac Risk Stratification Moderate          6 Minute Walk:  6 Minute Walk     Row Name 07/27/24 1432         6 Minute Walk   Phase Initial     Distance 1305 feet     Walk Time 6 minutes     # of Rest Breaks 0     MPH 2.47     METS 1.4     RPE 13     Perceived Dyspnea  2.5     VO2 Peak 4.9     Symptoms Yes (comment)     Comments SOB     Resting HR 65 bpm     Resting BP 124/70     Resting Oxygen Saturation  99 %     Exercise Oxygen Saturation  during 6 min walk 92 %     Max Ex. HR 71 bpm     Max Ex. BP 120/70     2 Minute Post BP 120/76       Interval HR   1 Minute HR 70     2 Minute HR 71     3 Minute HR 71     4 Minute HR 71     5 Minute HR 71     6 Minute HR 71     2 Minute Post HR 68     Interval Heart Rate? Yes       Interval Oxygen   Interval Oxygen? Yes     Baseline Oxygen Saturation % 99 %     1 Minute Oxygen Saturation % 93 %     1 Minute Liters of Oxygen 0 L     2 Minute Oxygen Saturation % 92 %     2 Minute Liters of Oxygen 0 L     3 Minute Oxygen Saturation % 92 %     3 Minute Liters of Oxygen 0 L     4 Minute Oxygen Saturation % 94 %     4 Minute Liters of Oxygen 0 L     5 Minute Oxygen Saturation % 94 %     5 Minute Liters of Oxygen 0 L     6 Minute Oxygen Saturation % 95 %     6 Minute Liters of Oxygen 0 L     2 Minute Post Oxygen Saturation % 97 %     2 Minute Post Liters of Oxygen 0 L        Oxygen Initial Assessment:  Oxygen Initial Assessment - 07/27/24 1337       Home Oxygen   Home Oxygen Device None    Sleep Oxygen Prescription None    Home  Exercise Oxygen Prescription None    Home Resting Oxygen Prescription None      Initial 6 min Walk   Oxygen Used None      Program Oxygen Prescription   Program Oxygen Prescription None      Intervention   Short Term Goals To learn and understand importance of maintaining oxygen  saturations>88%;To learn and understand importance of monitoring SPO2 with pulse oximeter and demonstrate accurate use of the pulse oximeter.;To learn and demonstrate proper pursed lip breathing techniques or other breathing techniques. ;To learn and demonstrate proper use of respiratory medications    Long  Term Goals Maintenance of O2 saturations>88%;Compliance with respiratory medication;Verbalizes importance of monitoring SPO2 with pulse oximeter and return demonstration;Exhibits proper breathing techniques, such as pursed lip breathing or other method taught during program session;Demonstrates proper use of MDIs          Oxygen Re-Evaluation:   Oxygen Discharge (Final Oxygen Re-Evaluation):   Initial Exercise Prescription:  Initial Exercise Prescription - 07/27/24 1400       Date of Initial Exercise RX and Referring Provider   Date 07/27/24    Referring Provider Kassie    Expected Discharge Date 10/20/24      Bike   Level 1    Watts 20    Minutes 15    METs 1.5      Recumbant Elliptical   Level 2    RPM 60    Watts 20    Minutes 15    METs 2      Prescription Details   Frequency (times per week) 2    Duration Progress to 30 minutes of continuous aerobic without signs/symptoms of physical distress      Intensity   THRR 40-80% of Max Heartrate 56-112    Ratings of Perceived Exertion 11-13    Perceived Dyspnea 0-4      Progression   Progression Continue progressive overload as per policy without signs/symptoms or physical distress.      Resistance Training   Training Prescription Yes    Weight blue bands    Reps 10-15          Perform Capillary Blood Glucose checks as needed.  Exercise Prescription Changes:   Exercise Comments:   Exercise Goals and Review:   Exercise Goals     Row Name 07/27/24 1319             Exercise Goals   Increase Physical Activity Yes       Intervention Develop an individualized exercise prescription for aerobic  and resistive training based on initial evaluation findings, risk stratification, comorbidities and participant's personal goals.;Provide advice, education, support and counseling about physical activity/exercise needs.       Expected Outcomes Short Term: Attend rehab on a regular basis to increase amount of physical activity.;Long Term: Add in home exercise to make exercise part of routine and to increase amount of physical activity.;Long Term: Exercising regularly at least 3-5 days a week.       Increase Strength and Stamina Yes       Intervention Provide advice, education, support and counseling about physical activity/exercise needs.;Develop an individualized exercise prescription for aerobic and resistive training based on initial evaluation findings, risk stratification, comorbidities and participant's personal goals.       Expected Outcomes Short Term: Increase workloads from initial exercise prescription for resistance, speed, and METs.;Short Term: Perform resistance training exercises routinely during rehab and add in resistance training at home;Long Term: Improve cardiorespiratory fitness,  muscular endurance and strength as measured by increased METs and functional capacity ( )       Able to understand and use rate of perceived exertion (RPE) scale Yes       Intervention Provide education and explanation on how to use RPE scale       Expected Outcomes Short Term: Able to use RPE daily in rehab to express subjective intensity level;Long Term:  Able to use RPE to guide intensity level when exercising independently       Able to understand and use Dyspnea scale Yes       Intervention Provide education and explanation on how to use Dyspnea scale       Expected Outcomes Short Term: Able to use Dyspnea scale daily in rehab to express subjective sense of shortness of breath during exertion;Long Term: Able to use Dyspnea scale to guide intensity level when exercising independently       Knowledge and  understanding of Target Heart Rate Range (THRR) Yes       Intervention Provide education and explanation of THRR including how the numbers were predicted and where they are located for reference       Expected Outcomes Short Term: Able to state/look up THRR;Short Term: Able to use daily as guideline for intensity in rehab;Long Term: Able to use THRR to govern intensity when exercising independently       Understanding of Exercise Prescription Yes       Intervention Provide education, explanation, and written materials on patient's individual exercise prescription       Expected Outcomes Short Term: Able to explain program exercise prescription;Long Term: Able to explain home exercise prescription to exercise independently          Exercise Goals Re-Evaluation :   Discharge Exercise Prescription (Final Exercise Prescription Changes):   Nutrition:  Target Goals: Understanding of nutrition guidelines, daily intake of sodium 1500mg , cholesterol 200mg , calories 30% from fat and 7% or less from saturated fats, daily to have 5 or more servings of fruits and vegetables.  Biometrics:  Pre Biometrics - 07/27/24 1435       Pre Biometrics   Grip Strength 20 kg           Nutrition Therapy Plan and Nutrition Goals:   Nutrition Assessments:  MEDIFICTS Score Key: >=70 Need to make dietary changes  40-70 Heart Healthy Diet <= 40 Therapeutic Level Cholesterol Diet   Picture Your Plate Scores: <59 Unhealthy dietary pattern with much room for improvement. 41-50 Dietary pattern unlikely to meet recommendations for good health and room for improvement. 51-60 More healthful dietary pattern, with some room for improvement.  >60 Healthy dietary pattern, although there may be some specific behaviors that could be improved.    Nutrition Goals Re-Evaluation:   Nutrition Goals Discharge (Final Nutrition Goals Re-Evaluation):   Psychosocial: Target Goals: Acknowledge presence or absence of  significant depression and/or stress, maximize coping skills, provide positive support system. Participant is able to verbalize types and ability to use techniques and skills needed for reducing stress and depression.  Initial Review & Psychosocial Screening:  Initial Psych Review & Screening - 07/27/24 1331       Initial Review   Current issues with Current Stress Concerns    Source of Stress Concerns Chronic Illness;Unable to participate in former interests or hobbies    Comments Gordy expresses that his SOB causes him to feel stressed.      Family Dynamics   Good Support System? Yes  Barriers   Psychosocial barriers to participate in program The patient should benefit from training in stress management and relaxation.      Screening Interventions   Interventions Encouraged to exercise;To provide support and resources with identified psychosocial needs    Expected Outcomes Short Term goal: Utilizing psychosocial counselor, staff and physician to assist with identification of specific Stressors or current issues interfering with healing process. Setting desired goal for each stressor or current issue identified.;Long Term Goal: Stressors or current issues are controlled or eliminated.;Short Term goal: Identification and review with participant of any Quality of Life or Depression concerns found by scoring the questionnaire.;Long Term goal: The participant improves quality of Life and PHQ9 Scores as seen by post scores and/or verbalization of changes          Quality of Life Scores:  Scores of 19 and below usually indicate a poorer quality of life in these areas.  A difference of  2-3 points is a clinically meaningful difference.  A difference of 2-3 points in the total score of the Quality of Life Index has been associated with significant improvement in overall quality of life, self-image, physical symptoms, and general health in studies assessing change in quality of  life.  PHQ-9: Review Flowsheet       07/27/2024 05/28/2024 12/27/2023 12/12/2023  Depression screen PHQ 2/9  Decreased Interest 0 0 0 0  Down, Depressed, Hopeless 0 0 0 0  PHQ - 2 Score 0 0 0 0  Altered sleeping 0 - - -  Tired, decreased energy 1 - - -  Change in appetite 0 - - -  Feeling bad or failure about yourself  0 - - -  Trouble concentrating 1 - - -  Moving slowly or fidgety/restless 0 - - -  Suicidal thoughts 0 - - -  PHQ-9 Score 2 - - -  Difficult doing work/chores Not difficult at all - - -   Interpretation of Total Score  Total Score Depression Severity:  1-4 = Minimal depression, 5-9 = Mild depression, 10-14 = Moderate depression, 15-19 = Moderately severe depression, 20-27 = Severe depression   Psychosocial Evaluation and Intervention:  Psychosocial Evaluation - 07/27/24 1358       Psychosocial Evaluation & Interventions   Interventions Stress management education;Relaxation education;Encouraged to exercise with the program and follow exercise prescription    Comments Gordy expresses that his SOB causes him to feel stressed at times. The decline in his shortness of breath has caused him and his wife to not be able to travel as much as they would like. Staff will provide Gordy with resources on how to manage his stress.    Expected Outcomes For Gordy to have less stress while participating in PR    Continue Psychosocial Services  No Follow up required          Psychosocial Re-Evaluation:   Psychosocial Discharge (Final Psychosocial Re-Evaluation):   Education: Education Goals: Education classes will be provided on a weekly basis, covering required topics. Participant will state understanding/return demonstration of topics presented.  Learning Barriers/Preferences:  Learning Barriers/Preferences - 07/27/24 1334       Learning Barriers/Preferences   Learning Barriers Sight   wears glasses   Learning Preferences None          Education Topics: Know Your  Numbers Group instruction that is supported by a PowerPoint presentation. Instructor discusses importance of knowing and understanding resting, exercise, and post-exercise oxygen saturation, heart rate, and blood pressure. Oxygen saturation, heart  rate, blood pressure, rating of perceived exertion, and dyspnea are reviewed along with a normal range for these values.    Exercise for the Pulmonary Patient Group instruction that is supported by a PowerPoint presentation. Instructor discusses benefits of exercise, core components of exercise, frequency, duration, and intensity of an exercise routine, importance of utilizing pulse oximetry during exercise, safety while exercising, and options of places to exercise outside of rehab.    MET Level  Group instruction provided by PowerPoint, verbal discussion, and written material to support subject matter. Instructor reviews what METs are and how to increase METs.    Pulmonary Medications Verbally interactive group education provided by instructor with focus on inhaled medications and proper administration.   Anatomy and Physiology of the Respiratory System Group instruction provided by PowerPoint, verbal discussion, and written material to support subject matter. Instructor reviews respiratory cycle and anatomical components of the respiratory system and their functions. Instructor also reviews differences in obstructive and restrictive respiratory diseases with examples of each.    Oxygen Safety Group instruction provided by PowerPoint, verbal discussion, and written material to support subject matter. There is an overview of What is Oxygen and Why do we need it.  Instructor also reviews how to create a safe environment for oxygen use, the importance of using oxygen as prescribed, and the risks of noncompliance. There is a brief discussion on traveling with oxygen and resources the patient may utilize.   Oxygen Use Group instruction provided by  PowerPoint, verbal discussion, and written material to discuss how supplemental oxygen is prescribed and different types of oxygen supply systems. Resources for more information are provided.    Breathing Techniques Group instruction that is supported by demonstration and informational handouts. Instructor discusses the benefits of pursed lip and diaphragmatic breathing and detailed demonstration on how to perform both.     Risk Factor Reduction Group instruction that is supported by a PowerPoint presentation. Instructor discusses the definition of a risk factor, different risk factors for pulmonary disease, and how the heart and lungs work together.   Pulmonary Diseases Group instruction provided by PowerPoint, verbal discussion, and written material to support subject matter. Instructor gives an overview of the different type of pulmonary diseases. There is also a discussion on risk factors and symptoms as well as ways to manage the diseases.   Stress and Energy Conservation Group instruction provided by PowerPoint, verbal discussion, and written material to support subject matter. Instructor gives an overview of stress and the impact it can have on the body. Instructor also reviews ways to reduce stress. There is also a discussion on energy conservation and ways to conserve energy throughout the day.   Warning Signs and Symptoms Group instruction provided by PowerPoint, verbal discussion, and written material to support subject matter. Instructor reviews warning signs and symptoms of stroke, heart attack, cold and flu. Instructor also reviews ways to prevent the spread of infection.   Other Education Group or individual verbal, written, or video instructions that support the educational goals of the pulmonary rehab program.    Knowledge Questionnaire Score:  Knowledge Questionnaire Score - 07/27/24 1318       Knowledge Questionnaire Score   Pre Score 16/18          Core  Components/Risk Factors/Patient Goals at Admission:  Personal Goals and Risk Factors at Admission - 07/27/24 1334       Core Components/Risk Factors/Patient Goals on Admission    Weight Management Weight Loss;Yes    Intervention Weight  Management: Develop a combined nutrition and exercise program designed to reach desired caloric intake, while maintaining appropriate intake of nutrient and fiber, sodium and fats, and appropriate energy expenditure required for the weight goal.;Weight Management: Provide education and appropriate resources to help participant work on and attain dietary goals.;Weight Management/Obesity: Establish reasonable short term and long term weight goals.;Obesity: Provide education and appropriate resources to help participant work on and attain dietary goals.    Admit Weight 258 lb 9.6 oz (117.3 kg)    Expected Outcomes Short Term: Continue to assess and modify interventions until short term weight is achieved;Long Term: Adherence to nutrition and physical activity/exercise program aimed toward attainment of established weight goal;Weight Loss: Understanding of general recommendations for a balanced deficit meal plan, which promotes 1-2 lb weight loss per week and includes a negative energy balance of [id number] kcal/d;Understanding recommendations for meals to include 15-35% energy as protein, 25-35% energy from fat, 35-60% energy from carbohydrates, less than 200mg  of dietary cholesterol, 20-35 gm of total fiber daily;Understanding of distribution of calorie intake throughout the day with the consumption of 4-5 meals/snacks    Improve shortness of breath with ADL's Yes    Intervention Provide education, individualized exercise plan and daily activity instruction to help decrease symptoms of SOB with activities of daily living.    Expected Outcomes Long Term: Be able to perform more ADLs without symptoms or delay the onset of symptoms;Short Term: Improve cardiorespiratory fitness to  achieve a reduction of symptoms when performing ADLs          Core Components/Risk Factors/Patient Goals Review:    Core Components/Risk Factors/Patient Goals at Discharge (Final Review):    ITP Comments:   Comments: Dr. Slater Staff is Medical Director for Pulmonary Rehab at [facility].       [1]  Current Outpatient Medications:    acetaminophen  (TYLENOL ) 500 MG tablet, Take 1,000 mg by mouth every 6 (six) hours as needed for mild pain (pain score 1-3) or moderate pain (pain score 4-6)., Disp: , Rfl:    amLODipine  (NORVASC ) 2.5 MG tablet, Take 1 tablet (2.5 mg total) by mouth daily. (Patient taking differently: Take 5 mg by mouth daily.), Disp: 30 tablet, Rfl: 0   aspirin EC 81 MG tablet, Take 81 mg by mouth daily. Swallow whole., Disp: , Rfl:    azelastine (ASTELIN) 0.1 % nasal spray, Place 1 spray into both nostrils daily as needed for rhinitis or allergies. Use in each nostril as directed, Disp: , Rfl:    benazepril  (LOTENSIN ) 20 MG tablet, Take 1 tablet (20 mg total) by mouth daily., Disp: 90 tablet, Rfl: 1   carvedilol  (COREG ) 12.5 MG tablet, Take 1 tablet (12.5 mg total) by mouth 2 (two) times daily., Disp: 180 tablet, Rfl: 1   fexofenadine (ALLEGRA) 180 MG tablet, Take 180 mg by mouth daily as needed for allergies or rhinitis., Disp: , Rfl:    LUTEIN PO, Take 25 mg by mouth daily., Disp: , Rfl:    Multiple Vitamin (MULTIVITAMIN WITH MINERALS) TABS tablet, Take 1 tablet by mouth daily., Disp: , Rfl:    rosuvastatin (CRESTOR) 5 MG tablet, Take 5 mg by mouth daily., Disp: , Rfl:    SYMBICORT  160-4.5 MCG/ACT inhaler, Inhale 2 puffs into the lungs 2 (two) times daily., Disp: 1 each, Rfl: 12 [2]  Social History Tobacco Use  Smoking Status Never   Passive exposure: Never  Smokeless Tobacco Never

## 2024-08-04 ENCOUNTER — Encounter (HOSPITAL_COMMUNITY)
Admission: RE | Admit: 2024-08-04 | Discharge: 2024-08-04 | Disposition: A | Source: Ambulatory Visit | Attending: Pulmonary Disease

## 2024-08-04 DIAGNOSIS — J984 Other disorders of lung: Secondary | ICD-10-CM

## 2024-08-04 NOTE — Progress Notes (Signed)
 Daily Session Note  Patient Details  Name: Michael Pace MRN: [id number] Date of Birth: [birth year] Referring Provider:   Flowsheet Row PULMONARY REHAB OTHER RESP ORIENTATION from 07/27/2024 in [facility]  Referring Provider Kassie    Encounter Date: 08/04/2024  Check In:  Session Check In - 08/04/24 1126       Check-In   Supervising physician immediately available to respond to emergencies [facility] MD immediately available    Physician(s) Rosaline Skains, NP    Location [facility]    Staff Present Ronal Levin, RN, BSN;Casey Claudene, RT;Randi [facility] BS, ACSM-CEP, Exercise Physiologist;Saanya Zieske Nicholaus, MS, ACSM-CEP, Exercise Physiologist    Virtual Visit No    Medication changes reported     No    Fall or balance concerns reported    Yes    Tobacco Cessation No Change    Warm-up and Cool-down Performed as group-led instruction    Resistance Training Performed Yes    VAD Patient? No    PAD/SET Patient? No      Pain Assessment   Currently in Pain? No/denies    Multiple Pain Sites No          Capillary Blood Glucose: No results found for this or any previous visit (from the past 24 hours).    Tobacco Use History[1]  Goals Met:  Proper associated with RPD/PD & O2 Sat Exercise tolerated well No report of concerns or symptoms today Strength training completed today  Goals Unmet:  Not Applicable  Comments: Service time is from 1010 to 1142.    Dr. Slater Kassie is Medical Director for Pulmonary Rehab at [facility].     [1]  Social History Tobacco Use  Smoking Status Never   Passive exposure: Never  Smokeless Tobacco Never

## 2024-08-06 ENCOUNTER — Ambulatory Visit: Admitting: Nurse Practitioner

## 2024-08-06 ENCOUNTER — Encounter (HOSPITAL_COMMUNITY)
Admission: RE | Admit: 2024-08-06 | Discharge: 2024-08-06 | Disposition: A | Source: Ambulatory Visit | Attending: Pulmonary Disease

## 2024-08-06 ENCOUNTER — Encounter: Payer: Self-pay | Admitting: Nurse Practitioner

## 2024-08-06 VITALS — Wt 256.4 lb

## 2024-08-06 VITALS — BP 124/70 | HR 82 | Temp 98.4°F | Ht 70.0 in | Wt 256.0 lb

## 2024-08-06 DIAGNOSIS — Z0000 Encounter for general adult medical examination without abnormal findings: Secondary | ICD-10-CM

## 2024-08-06 DIAGNOSIS — J984 Other disorders of lung: Secondary | ICD-10-CM | POA: Diagnosis not present

## 2024-08-06 NOTE — Progress Notes (Signed)
 "  Chief Complaint  Patient presents with   Medicare Wellness    Annual wellness visit. 6CIT completed in May 2025 (less than 1 year ago)      Subjective:   Michael Pace is a 79 y.o. male who presents for a Medicare Annual Wellness Visit @ clinic [facility]  Visit info / Clinical Intake: Medicare Wellness Visit Type:: Subsequent Annual Wellness Visit Persons participating in visit and providing information:: patient Medicare Wellness Visit Mode:: In-person (required for WTM) Interpreter Needed?: No Pre-visit prep was completed: yes AWV questionnaire completed by patient prior to visit?: no Living arrangements:: lives with spouse/significant other Patient's Overall Health Status Rating: good Typical amount of pain: some Does pain affect daily life?: no Are you currently prescribed opioids?: no  Dietary Habits and Nutritional Risks How many meals a day?: 5 Eats fruit and vegetables daily?: yes Most meals are obtained by: preparing own meals; eating out; having others provide food In the last 2 weeks, have you had any of the following?: none Diabetic:: no  Functional Status Activities of Daily Living (to include ambulation/medication): Independent Ambulation: Independent Medication Administration: Independent Home Management (perform basic housework or laundry): (Patient-Rptd) Independent Manage your own finances?: yes Primary transportation is: driving Concerns about vision?: no *vision screening is required for WTM* Concerns about hearing?: no  Fall Screening Falls in the past year?: 1 Number of falls in past year: 0 Was there an injury with Fall?: 0 Fall Risk Category Calculator: 1 Patient Fall Risk Level: Low Fall Risk  Fall Risk Patient at Risk for Falls Due to: History of fall(s) Fall risk Follow up: Falls evaluation completed  Home and Transportation Safety: All rugs have non-skid backing?: (Patient-Rptd) yes All stairs or steps have railings?: (Patient-Rptd)  N/A, no stairs Grab bars in the bathtub or shower?: (Patient-Rptd) yes Have non-skid surface in bathtub or shower?: (Patient-Rptd) yes Good home lighting?: (Patient-Rptd) yes Regular seat belt use?: (Patient-Rptd) yes Hospital stays in the last year:: (Patient-Rptd) no  Cognitive Assessment Difficulty concentrating, remembering, or making decisions? : yes (memory) Will 6CIT or Mini Cog be Completed: yes What year is it?: 0 points What month is it?: 0 points Give patient an address phrase to remember (5 components): [address] [name] what time is it?: 0 points Count backwards from 20 to 1: 0 points Say the months of the year in reverse: 0 points Repeat the address phrase from earlier: 0 points 6 CIT Score: 0 points  Advance Directives (For Healthcare) Does Patient Have a Medical Advance Directive?: Yes Does patient want to make changes to medical advance directive?: No - Patient declined Type of Advance Directive: Healthcare Power of [place]; Living will Copy of Healthcare Power of Attorney in Chart?: Yes - validated most recent copy scanned in chart (See row information) Copy of Living Will in Chart?: Yes - validated most recent copy scanned in chart (See row information) Out of facility DNR (pink MOST or yellow form) in Chart? (Ambulatory ONLY): Yes - validated most recent copy scanned in chart  Reviewed/Updated  Reviewed/Updated: Reviewed All (Medical, Surgical, Family, Medications, Allergies, Care Teams, Patient Goals)    Allergies (verified) Celebrex [celecoxib], Codeine, Penicillins, and Tramadol   Current Medications (verified) Outpatient Encounter Medications as of 08/06/2024  Medication Sig   acetaminophen  (TYLENOL ) 500 MG tablet Take 1,000 mg by mouth every 6 (six) hours as needed for mild pain (pain score 1-3) or moderate pain (pain score 4-6).   aspirin EC 81 MG tablet Take 81  mg by mouth daily. Swallow whole.   azelastine (ASTELIN) 0.1 % nasal  spray Place 1 spray into both nostrils daily as needed for rhinitis or allergies. Use in each nostril as directed   ibuprofen (ADVIL) 200 MG tablet Take 200 mg by mouth as needed.   LUTEIN PO Take 25 mg by mouth daily.   Multiple Vitamin (MULTIVITAMIN WITH MINERALS) TABS tablet Take 1 tablet by mouth daily.   SYMBICORT  160-4.5 MCG/ACT inhaler Inhale 2 puffs into the lungs 2 (two) times daily.   amLODipine  (NORVASC ) 2.5 MG tablet Take 1 tablet (2.5 mg total) by mouth daily. (Patient not taking: Reported on 08/06/2024)   amLODipine  (NORVASC ) 5 MG tablet Take 1 tablet (5 mg total) by mouth daily.   benazepril  (LOTENSIN ) 20 MG tablet Take 1 tablet (20 mg total) by mouth daily.   carvedilol  (COREG ) 12.5 MG tablet Take 1 tablet (12.5 mg total) by mouth 2 (two) times daily.   fexofenadine  (ALLEGRA ) 180 MG tablet Take 1 tablet (180 mg total) by mouth daily as needed for allergies or rhinitis.   rosuvastatin  (CRESTOR ) 5 MG tablet Take 1 tablet (5 mg total) by mouth daily.   [DISCONTINUED] amLODipine  (NORVASC ) 5 MG tablet Take 5 mg by mouth daily.   [DISCONTINUED] benazepril  (LOTENSIN ) 20 MG tablet Take 1 tablet (20 mg total) by mouth daily.   [DISCONTINUED] carvedilol  (COREG ) 12.5 MG tablet Take 1 tablet (12.5 mg total) by mouth 2 (two) times daily.   [DISCONTINUED] fexofenadine  (ALLEGRA ) 180 MG tablet Take 180 mg by mouth daily as needed for allergies or rhinitis.   [DISCONTINUED] rosuvastatin  (CRESTOR ) 5 MG tablet Take 5 mg by mouth daily.   No facility-administered encounter medications on file as of 08/06/2024.    History: Past Medical History:  Diagnosis Date   Allergy    Arthritis    BPH (benign prostatic hyperplasia)    Bradycardia    Cataracts, bilateral    Dilated aortic root    Dyspnea    Dysrhythmia    Family history of adverse reaction to anesthesia    Dad slow to wake up   GERD (gastroesophageal reflux disease)    Hypertension    Melanoma (HCC) 1988   LEFT ABDOMEN [place]- [place]     Nodular infiltrative basal cell carcinoma (BCC) 05/10/2021   Left Forearm - posterior   Nonrheumatic mitral valve regurgitation    Sleep apnea    No CPAP   Squamous cell carcinoma of skin 03/29/2016   RIGHT SIDEBURN TX= CX3 5FU   Past Surgical History:  Procedure Laterality Date   CATARACT EXTRACTION, BILATERAL     COSMETIC SURGERY     CYSTECTOMY  03/17/2024   EYE SURGERY     FRACTURE SURGERY     JOINT REPLACEMENT     REVERSE SHOULDER ARTHROPLASTY Right 09/13/2022   Procedure: REVERSE SHOULDER ARTHROPLASTY;  Surgeon: Dozier Soulier, MD;  Location: [facility];  Service: Orthopedics;  Laterality: Right;   REVERSE SHOULDER ARTHROPLASTY Left 06/13/2023   Procedure: REVERSE SHOULDER ARTHROPLASTY;  Surgeon: Dozier Soulier, MD;  Location: [facility];  Service: Orthopedics;  Laterality: Left;   Scrotal surgery     SHOULDER ARTHROSCOPY Right 10/04/2023   SKIN CANCER EXCISION     Melanoma   TONSILLECTOMY     TOTAL KNEE ARTHROPLASTY     bilateral   TRANSESOPHAGEAL ECHOCARDIOGRAM (CATH LAB) N/A 02/03/2024   Procedure: TRANSESOPHAGEAL ECHOCARDIOGRAM;  Surgeon: Shlomo Wilbert SAUNDERS, MD;  Location: [facility];  Service: Cardiovascular;  Laterality: N/A;  Family History  Problem Relation Age of Onset   Cancer Mother        No primary   Arthritis Mother    Arthritis Father    Heart disease Father    Varicose Veins Father    Social History   Occupational History   Not on file  Tobacco Use   Smoking status: Never    Passive exposure: Never   Smokeless tobacco: Never  Vaping Use   Vaping status: Never Used  Substance and Sexual Activity   Alcohol  use: Yes    Alcohol /week: 2.0 standard drinks of alcohol     Types: 2 Glasses of wine per week    Comment: 2 drinks a week   Drug use: Never   Sexual activity: Not Currently   Tobacco Counseling Counseling given: Not Answered  SDOH Screenings   Food Insecurity: No Food Insecurity (08/05/2024)  Housing: Low Risk (08/05/2024)   Transportation Needs: No Transportation Needs (08/05/2024)  Alcohol  Screen: Low Risk (08/05/2024)  Depression (PHQ2-9): Low Risk (08/06/2024)  Financial Resource Strain: Low Risk (08/05/2024)  Physical Activity: Sufficiently Active (08/06/2024)  Social Connections: Socially Integrated (08/06/2024)  Stress: Stress Concern Present (08/06/2024)  Tobacco Use: Low Risk (07/27/2024)   See flowsheets for full screening details  Depression Screen PHQ 2 & 9 Depression Scale- Over the past 2 weeks, how often have you been bothered by any of the following problems? Little interest or pleasure in doing things: 0 Feeling down, depressed, or hopeless (PHQ Adolescent also includes...irritable): 0 PHQ-2 Total Score: 0 Trouble falling or staying asleep, or sleeping too much: 0 Feeling tired or having little energy: 1 Poor appetite or overeating (PHQ Adolescent also includes...weight loss): 0 Feeling bad about yourself - or that you are a failure or have let yourself or your family down: 0 Trouble concentrating on things, such as reading the newspaper or watching television (PHQ Adolescent also includes...like school work): 1 Moving or speaking so slowly that other people could have noticed. Or the opposite - being so fidgety or restless that you have been moving around a lot more than usual: 0 Thoughts that you would be better off dead, or of hurting yourself in some way: 0 PHQ-9 Total Score: 2 If you checked off any problems, how difficult have these problems made it for you to do your work, take care of things at home, or get along with other people?: Not difficult at all     Goals Addressed   None          Objective:    Today's Vitals   08/06/24 1416  BP: 124/70  Pulse: 82  Temp: 98.4 F (36.9 C)  SpO2: 96%  Weight: 256 lb (116.1 kg)  Height: 5' 10 (1.778 m)   Body mass index is 36.73 kg/m.  Hearing/Vision screen Hearing Screening - Comments:: Patient with hearing issues, would like  referral to audiology  Vision Screening - Comments:: Last eye exam less than 12 months ago, ? Name of doctor  Immunizations and Health Maintenance Health Maintenance  Topic Date Due   COVID-19 Vaccine (7 - Pfizer risk 2025-26 season) 10/26/2024   DTaP/Tdap/Td (2 - Td or Tdap) 07/25/2025   Medicare Annual Wellness (AWV)  08/06/2025   Pneumococcal Vaccine: 50+ Years  Completed   Influenza Vaccine  Completed   Zoster Vaccines- Shingrix  Completed   Meningococcal B Vaccine  Aged Out        Assessment/Plan:  This is a routine wellness examination for Michael Pace.  Patient  Care Team: Eldridge Marcott X, NP as PCP - General (Internal Medicine) Lavona Agent, MD as PCP - Cardiology (Cardiology) Livingston Rigg, MD as Consulting Physician (Dermatology)  I have personally reviewed and noted the following in the patients chart:   Medical and social history Use of alcohol , tobacco or illicit drugs  Current medications and supplements including opioid prescriptions. Functional ability and status Nutritional status Physical activity Advanced directives List of other physicians Hospitalizations, surgeries, and ER visits in previous 12 months Vitals Screenings to include cognitive, depression, and falls Referrals and appointments  Orders Placed This Encounter  Procedures   Ambulatory referral to Audiology    Referral Priority:   Routine    Referral Type:   Audiology Exam    Referral Reason:   Specialty Services Required    Number of Visits Requested:   1   In addition, I have reviewed and discussed with patient certain preventive protocols, quality metrics, and best practice recommendations. A written personalized care plan for preventive services as well as general preventive health recommendations were provided to patient.   Matt Delpizzo X Shahed Yeoman, NP   08/07/2024   Return in 1 year (on 08/06/2025).  After Visit Summary: (In Person-Printed) AVS printed and given to the patient   "

## 2024-08-06 NOTE — Progress Notes (Signed)
 Daily Session Note  Patient Details  Name: Michael Pace MRN: [id number] Date of Birth: 1945/09/09 Referring Provider:   Flowsheet Row PULMONARY REHAB OTHER RESP ORIENTATION from 07/27/2024 in [facility]  Referring Provider Kassie    Encounter Date: 08/06/2024  Check In:  Session Check In - 08/06/24 1027       Check-In   Supervising physician immediately available to respond to emergencies [facility] MD immediately available    Physician(s) Lum Louis, NP    Location [facility]    Staff Present Ronal Levin, RN, BSN;Casey Claudene, RT;Lotta [place] BS, ACSM-CEP, Exercise Physiologist;Kaylee Avilla, MS, ACSM-CEP, Exercise Physiologist    Virtual Visit No    Medication changes reported     No    Fall or balance concerns reported    Yes    Tobacco Cessation No Change    Warm-up and Cool-down Performed as group-led instruction    Resistance Training Performed Yes    VAD Patient? No    PAD/SET Patient? No      Pain Assessment   Currently in Pain? No/denies    Multiple Pain Sites No          Capillary Blood Glucose: No results found for this or any previous visit (from the past 24 hours).    Tobacco Use History[1]  Goals Met:  Proper associated with RPD/PD & O2 Sat Exercise tolerated well No report of concerns or symptoms today Strength training completed today  Goals Unmet:  Not Applicable  Comments: Service time is from 1013 to 1140.    Dr. Slater Kassie is Medical Director for Pulmonary Rehab at [facility].     [1]  Social History Tobacco Use  Smoking Status Never   Passive exposure: Never  Smokeless Tobacco Never

## 2024-08-07 MED ORDER — CARVEDILOL 12.5 MG PO TABS: 12.5000 mg | ORAL_TABLET | Freq: Two times a day (BID) | ORAL | 1 refills | Status: AC

## 2024-08-07 MED ORDER — FEXOFENADINE HCL 180 MG PO TABS: 180.0000 mg | ORAL_TABLET | Freq: Every day | ORAL | 1 refills | Status: AC | PRN

## 2024-08-07 MED ORDER — BENAZEPRIL HCL 20 MG PO TABS: 20.0000 mg | ORAL_TABLET | Freq: Every day | ORAL | 1 refills | Status: AC

## 2024-08-07 MED ORDER — AMLODIPINE BESYLATE 5 MG PO TABS: 5.0000 mg | ORAL_TABLET | Freq: Every day | ORAL | 1 refills | Status: AC

## 2024-08-07 MED ORDER — ROSUVASTATIN CALCIUM 5 MG PO TABS: 5.0000 mg | ORAL_TABLET | Freq: Every day | ORAL | 1 refills | Status: AC

## 2024-08-10 ENCOUNTER — Telehealth (HOSPITAL_COMMUNITY): Payer: Self-pay

## 2024-08-10 NOTE — Telephone Encounter (Signed)
 Patient c/o sick for 10:15 PR class on 1/20.

## 2024-08-11 ENCOUNTER — Encounter (HOSPITAL_COMMUNITY)

## 2024-08-12 NOTE — Progress Notes (Signed)
 Pulmonary Individual Treatment Plan  Patient Details  Name: Michael Pace MRN: [id number] Date of Birth: February 16, 1946 Referring Provider:   Flowsheet Row PULMONARY REHAB OTHER RESP ORIENTATION from 07/27/2024 in [facility]  Referring Provider Kassie    Initial Encounter Date:  Flowsheet Row PULMONARY REHAB OTHER RESP ORIENTATION from 07/27/2024 in [facility]  Date 07/27/24    Visit Diagnosis: Restrictive lung disease  Patient's Home Medications on Admission:  Current Medications[1]  Past Medical History: Past Medical History:  Diagnosis Date   Allergy    Arthritis    BPH (benign prostatic hyperplasia)    Bradycardia    Cataracts, bilateral    Dilated aortic root    Dyspnea    Dysrhythmia    Family history of adverse reaction to anesthesia    Dad slow to wake up   GERD (gastroesophageal reflux disease)    Hypertension    Melanoma (HCC) 1988   LEFT ABDOMEN [place]- [place]    Nodular infiltrative basal cell carcinoma (BCC) 05/10/2021   Left Forearm - posterior   Nonrheumatic mitral valve regurgitation    Sleep apnea    No CPAP   Squamous cell carcinoma of skin 03/29/2016   RIGHT SIDEBURN TX= CX3 5FU    Tobacco Use: Tobacco Use History[2]  Labs: Review Flowsheet       Latest Ref Rng & Units 12/17/2023 02/03/2024  Labs for ITP Cardiac and Pulmonary Rehab  Cholestrol <200 mg/dL 818  -  LDL (calc) mg/dL (calc) 99  -  HDL-C > OR = 40 mg/dL 61  -  Trlycerides <849 mg/dL 890  -  TCO2 22 - 32 mmol/L - 22     Capillary Blood Glucose: No results found for: GLUCAP   Pulmonary Assessment Scores:  Pulmonary Assessment Scores     Row Name 07/27/24 1354         ADL UCSD   ADL Phase Entry     SOB Score total 37       CAT Score   CAT Score 22       mMRC Score   mMRC Score 3       UCSD: Self-administered rating of dyspnea associated with  activities of daily living (ADLs) 6-point scale (0 = not at all to 5 = maximal or unable to do because of breathlessness)  Scoring Scores range from 0 to 120.  Minimally important difference is 5 units  CAT: CAT can identify the health impairment of COPD patients and is better correlated with disease progression.  CAT has a scoring range of zero to 40. The CAT score is classified into four groups of low (less than 10), medium (10 - 20), high (21-30) and very high (31-40) based on the impact level of disease on health status. A CAT score over 10 suggests significant symptoms.  A worsening CAT score could be explained by an exacerbation, poor medication adherence, poor inhaler technique, or progression of COPD or comorbid conditions.  CAT MCID is 2 points  mMRC: mMRC (Modified Medical Research Council) Dyspnea Scale is used to assess the degree of baseline functional disability in patients of respiratory disease due to dyspnea. No minimal important difference is established. A decrease in score of 1 point or greater is considered a positive change.   Pulmonary Function Assessment:  Pulmonary Function Assessment - 07/27/24 1338       Breath   Bilateral Breath Sounds  Clear    Shortness of Breath Yes;Limiting activity          Exercise Target Goals: Exercise Program Goal: Individual exercise prescription set using results from initial 6 min walk test and THRR while considering  patients activity barriers and safety.   Exercise Prescription Goal: Initial exercise prescription builds to 30-45 minutes a day of aerobic activity, 2-3 days per week.  Home exercise guidelines will be given to patient during program as part of exercise prescription that the participant will acknowledge.  Activity Barriers & Risk Stratification:  Activity Barriers & Cardiac Risk Stratification - 07/27/24 1335       Activity Barriers & Cardiac Risk Stratification   Activity Barriers Deconditioning;Muscular  Weakness;Shortness of Breath;Left Knee Replacement;Right Knee Replacement;History of Falls    Cardiac Risk Stratification Moderate          6 Minute Walk:  6 Minute Walk     Row Name 07/27/24 1432         6 Minute Walk   Phase Initial     Distance 1305 feet     Walk Time 6 minutes     # of Rest Breaks 0     MPH 2.47     METS 1.4     RPE 13     Perceived Dyspnea  2.5     VO2 Peak 4.9     Symptoms Yes (comment)     Comments SOB     Resting HR 65 bpm     Resting BP 124/70     Resting Oxygen Saturation  99 %     Exercise Oxygen Saturation  during 6 min walk 92 %     Max Ex. HR 71 bpm     Max Ex. BP 120/70     2 Minute Post BP 120/76       Interval HR   1 Minute HR 70     2 Minute HR 71     3 Minute HR 71     4 Minute HR 71     5 Minute HR 71     6 Minute HR 71     2 Minute Post HR 68     Interval Heart Rate? Yes       Interval Oxygen   Interval Oxygen? Yes     Baseline Oxygen Saturation % 99 %     1 Minute Oxygen Saturation % 93 %     1 Minute Liters of Oxygen 0 L     2 Minute Oxygen Saturation % 92 %     2 Minute Liters of Oxygen 0 L     3 Minute Oxygen Saturation % 92 %     3 Minute Liters of Oxygen 0 L     4 Minute Oxygen Saturation % 94 %     4 Minute Liters of Oxygen 0 L     5 Minute Oxygen Saturation % 94 %     5 Minute Liters of Oxygen 0 L     6 Minute Oxygen Saturation % 95 %     6 Minute Liters of Oxygen 0 L     2 Minute Post Oxygen Saturation % 97 %     2 Minute Post Liters of Oxygen 0 L        Oxygen Initial Assessment:  Oxygen Initial Assessment - 07/27/24 1337       Home Oxygen   Home Oxygen Device None    Sleep Oxygen Prescription None  Home Exercise Oxygen Prescription None    Home Resting Oxygen Prescription None      Initial 6 min Walk   Oxygen Used None      Program Oxygen Prescription   Program Oxygen Prescription None      Intervention   Short Term Goals To learn and understand importance of maintaining oxygen  saturations>88%;To learn and understand importance of monitoring SPO2 with pulse oximeter and demonstrate accurate use of the pulse oximeter.;To learn and demonstrate proper pursed lip breathing techniques or other breathing techniques. ;To learn and demonstrate proper use of respiratory medications    Long  Term Goals Maintenance of O2 saturations>88%;Compliance with respiratory medication;Verbalizes importance of monitoring SPO2 with pulse oximeter and return demonstration;Exhibits proper breathing techniques, such as pursed lip breathing or other method taught during program session;Demonstrates proper use of MDIs          Oxygen Re-Evaluation:  Oxygen Re-Evaluation     Row Name 08/03/24 0909             Program Oxygen Prescription   Program Oxygen Prescription None         Home Oxygen   Home Oxygen Device None       Sleep Oxygen Prescription None       Home Exercise Oxygen Prescription None       Home Resting Oxygen Prescription None         Goals/Expected Outcomes   Short Term Goals To learn and understand importance of maintaining oxygen saturations>88%;To learn and understand importance of monitoring SPO2 with pulse oximeter and demonstrate accurate use of the pulse oximeter.;To learn and demonstrate proper pursed lip breathing techniques or other breathing techniques. ;To learn and demonstrate proper use of respiratory medications       Long  Term Goals Maintenance of O2 saturations>88%;Compliance with respiratory medication;Verbalizes importance of monitoring SPO2 with pulse oximeter and return demonstration;Exhibits proper breathing techniques, such as pursed lip breathing or other method taught during program session;Demonstrates proper use of MDIs       Goals/Expected Outcomes Compliance and understanding of oxygen saturation monitoring and breathing techniques to decrease shortness of breath.          Oxygen Discharge (Final Oxygen Re-Evaluation):  Oxygen Re-Evaluation  - 08/03/24 0909       Program Oxygen Prescription   Program Oxygen Prescription None      Home Oxygen   Home Oxygen Device None    Sleep Oxygen Prescription None    Home Exercise Oxygen Prescription None    Home Resting Oxygen Prescription None      Goals/Expected Outcomes   Short Term Goals To learn and understand importance of maintaining oxygen saturations>88%;To learn and understand importance of monitoring SPO2 with pulse oximeter and demonstrate accurate use of the pulse oximeter.;To learn and demonstrate proper pursed lip breathing techniques or other breathing techniques. ;To learn and demonstrate proper use of respiratory medications    Long  Term Goals Maintenance of O2 saturations>88%;Compliance with respiratory medication;Verbalizes importance of monitoring SPO2 with pulse oximeter and return demonstration;Exhibits proper breathing techniques, such as pursed lip breathing or other method taught during program session;Demonstrates proper use of MDIs    Goals/Expected Outcomes Compliance and understanding of oxygen saturation monitoring and breathing techniques to decrease shortness of breath.          Initial Exercise Prescription:  Initial Exercise Prescription - 07/27/24 1400       Date of Initial Exercise RX and Referring Provider   Date 07/27/24  Referring Provider Kassie    Expected Discharge Date 10/20/24      Bike   Level 1    Watts 20    Minutes 15    METs 1.5      Recumbant Elliptical   Level 2    RPM 60    Watts 20    Minutes 15    METs 2      Prescription Details   Frequency (times per week) 2    Duration Progress to 30 minutes of continuous aerobic without signs/symptoms of physical distress      Intensity   THRR 40-80% of Max Heartrate 56-112    Ratings of Perceived Exertion 11-13    Perceived Dyspnea 0-4      Progression   Progression Continue progressive overload as per policy without signs/symptoms or physical distress.       Resistance Training   Training Prescription Yes    Weight blue bands    Reps 10-15          Perform Capillary Blood Glucose checks as needed.  Exercise Prescription Changes:   Exercise Prescription Changes     Row Name 08/06/24 1147             Response to Exercise   Blood Pressure (Admit) 140/78       Blood Pressure (Exit) 104/60       Heart Rate (Admit) 67 bpm       Heart Rate (Exercise) 69 bpm       Heart Rate (Exit) 75 bpm       Oxygen Saturation (Admit) 99 %       Oxygen Saturation (Exercise) 96 %       Oxygen Saturation (Exit) 97 %       Rating of Perceived Exertion (Exercise) 15       Perceived Dyspnea (Exercise) 3       Duration Continue with 30 min of aerobic exercise without signs/symptoms of physical distress.       Intensity THRR unchanged         Progression   Progression Continue to progress workloads to maintain intensity without signs/symptoms of physical distress.         Resistance Training   Weight blue bands       Reps 10-15       Time 10 Minutes         Recumbant Bike   Level 1       RPM 47       Minutes 15       METs 1.8         Recumbant Elliptical   Level 2       Minutes 15       METs 1.2          Exercise Comments:   Exercise Comments     Row Name 08/04/24 1158           Exercise Comments Gordy completed his first day of exercise. He exercised for 15 min on the Octane and upright bike. He averaged 1 MET at level 2 on the Octane and 1.7 METs at level 1 on the upright bike. He performed the warmup and cooldown standing without limitations.          Exercise Goals and Review:   Exercise Goals     Row Name 07/27/24 1319             Exercise Goals   Increase Physical Activity Yes  Intervention Develop an individualized exercise prescription for aerobic and resistive training based on initial evaluation findings, risk stratification, comorbidities and participant's personal goals.;Provide advice, education, support  and counseling about physical activity/exercise needs.       Expected Outcomes Short Term: Attend rehab on a regular basis to increase amount of physical activity.;Long Term: Add in home exercise to make exercise part of routine and to increase amount of physical activity.;Long Term: Exercising regularly at least 3-5 days a week.       Increase Strength and Stamina Yes       Intervention Provide advice, education, support and counseling about physical activity/exercise needs.;Develop an individualized exercise prescription for aerobic and resistive training based on initial evaluation findings, risk stratification, comorbidities and participant's personal goals.       Expected Outcomes Short Term: Increase workloads from initial exercise prescription for resistance, speed, and METs.;Short Term: Perform resistance training exercises routinely during rehab and add in resistance training at home;Long Term: Improve cardiorespiratory fitness, muscular endurance and strength as measured by increased METs and functional capacity ( )       Able to understand and use rate of perceived exertion (RPE) scale Yes       Intervention Provide education and explanation on how to use RPE scale       Expected Outcomes Short Term: Able to use RPE daily in rehab to express subjective intensity level;Long Term:  Able to use RPE to guide intensity level when exercising independently       Able to understand and use Dyspnea scale Yes       Intervention Provide education and explanation on how to use Dyspnea scale       Expected Outcomes Short Term: Able to use Dyspnea scale daily in rehab to express subjective sense of shortness of breath during exertion;Long Term: Able to use Dyspnea scale to guide intensity level when exercising independently       Knowledge and understanding of Target Heart Rate Range (THRR) Yes       Intervention Provide education and explanation of THRR including how the numbers were predicted and where  they are located for reference       Expected Outcomes Short Term: Able to state/look up THRR;Short Term: Able to use daily as guideline for intensity in rehab;Long Term: Able to use THRR to govern intensity when exercising independently       Understanding of Exercise Prescription Yes       Intervention Provide education, explanation, and written materials on patient's individual exercise prescription       Expected Outcomes Short Term: Able to explain program exercise prescription;Long Term: Able to explain home exercise prescription to exercise independently          Exercise Goals Re-Evaluation :  Exercise Goals Re-Evaluation     Row Name 08/03/24 0904             Exercise Goal Re-Evaluation   Exercise Goals Review Increase Physical Activity;Able to understand and use Dyspnea scale;Understanding of Exercise Prescription;Increase Strength and Stamina;Knowledge and understanding of Target Heart Rate Range (THRR);Able to understand and use rate of perceived exertion (RPE) scale       Comments Gordy is scheduled to begin exercise on 1/13. Will continue to monitor and progress as able.       Expected Outcomes Through exercise at rehab and home, the patient will decrease shortness of breath with daily activities and feel confident in carrying out an exercise regimen at home.  Discharge Exercise Prescription (Final Exercise Prescription Changes):  Exercise Prescription Changes - 08/06/24 1147       Response to Exercise   Blood Pressure (Admit) 140/78    Blood Pressure (Exit) 104/60    Heart Rate (Admit) 67 bpm    Heart Rate (Exercise) 69 bpm    Heart Rate (Exit) 75 bpm    Oxygen Saturation (Admit) 99 %    Oxygen Saturation (Exercise) 96 %    Oxygen Saturation (Exit) 97 %    Rating of Perceived Exertion (Exercise) 15    Perceived Dyspnea (Exercise) 3    Duration Continue with 30 min of aerobic exercise without signs/symptoms of physical distress.    Intensity THRR unchanged       Progression   Progression Continue to progress workloads to maintain intensity without signs/symptoms of physical distress.      Resistance Training   Weight blue bands    Reps 10-15    Time 10 Minutes      Recumbant Bike   Level 1    RPM 47    Minutes 15    METs 1.8      Recumbant Elliptical   Level 2    Minutes 15    METs 1.2          Nutrition:  Target Goals: Understanding of nutrition guidelines, daily intake of sodium 1500mg , cholesterol 200mg , calories 30% from fat and 7% or less from saturated fats, daily to have 5 or more servings of fruits and vegetables.  Biometrics:  Pre Biometrics - 07/27/24 1435       Pre Biometrics   Grip Strength 20 kg           Nutrition Therapy Plan and Nutrition Goals:  Nutrition Therapy & Goals - 08/04/24 1257       Nutrition Therapy   Diet Heart Healthy      Personal Nutrition Goals   Nutrition Goal Patient to improve diet quality by using the plate method as a guide for meal planning to include lean protein/plant protein, fruits, vegetables, whole grains, nonfat dairy as part of a well-balanced diet.    Personal Goal #2 Patient to identify strategies for weight loss with goal of 0.5-2 # per week of weight loss.    Comments Pt with medical history significant for HTN, hyperlipidemia, restrictive lung disease. Pt verbalizes motivation to lose weight. Current wt within Class II Obesity category based on BMI. Pt and wife preparing majority of meals at home due to wife's food allergies and quality of meals prepared by retirement community. RD discussed healthy eating pattern using plate model. Provided suggestions for ways to promote healthy wt loss.      Intervention Plan   Intervention Prescribe, educate and counsel regarding individualized specific dietary modifications aiming towards targeted core components such as weight, hypertension, lipid management, diabetes, heart failure and other comorbidities.;Nutrition  handout(s) given to patient.   Handout: Weight Loss Considerations   Expected Outcomes Short Term Goal: Understand basic principles of dietary content, such as calories, fat, sodium, cholesterol and nutrients.;Long Term Goal: Adherence to prescribed nutrition plan.          Nutrition Assessments:  MEDIFICTS Score Key: >=70 Need to make dietary changes  40-70 Heart Healthy Diet <= 40 Therapeutic Level Cholesterol Diet  Flowsheet Row PULMONARY REHAB OTHER RESPIRATORY from 08/04/2024 in [facility]  Picture Your Plate Total Score on Admission 68   Picture Your Plate Scores: <  40 Unhealthy dietary pattern with much room for improvement. 41-50 Dietary pattern unlikely to meet recommendations for good health and room for improvement. 51-60 More healthful dietary pattern, with some room for improvement.  >60 Healthy dietary pattern, although there may be some specific behaviors that could be improved.    Nutrition Goals Re-Evaluation:   Nutrition Goals Discharge (Final Nutrition Goals Re-Evaluation):   Psychosocial: Target Goals: Acknowledge presence or absence of significant depression and/or stress, maximize coping skills, provide positive support system. Participant is able to verbalize types and ability to use techniques and skills needed for reducing stress and depression.  Initial Review & Psychosocial Screening:  Initial Psych Review & Screening - 07/27/24 1331       Initial Review   Current issues with Current Stress Concerns    Source of Stress Concerns Chronic Illness;Unable to participate in former interests or hobbies    Comments Gordy expresses that his SOB causes him to feel stressed.      Family Dynamics   Good Support System? Yes      Barriers   Psychosocial barriers to participate in program The patient should benefit from training in stress management and relaxation.      Screening Interventions    Interventions Encouraged to exercise;To provide support and resources with identified psychosocial needs    Expected Outcomes Short Term goal: Utilizing psychosocial counselor, staff and physician to assist with identification of specific Stressors or current issues interfering with healing process. Setting desired goal for each stressor or current issue identified.;Long Term Goal: Stressors or current issues are controlled or eliminated.;Short Term goal: Identification and review with participant of any Quality of Life or Depression concerns found by scoring the questionnaire.;Long Term goal: The participant improves quality of Life and PHQ9 Scores as seen by post scores and/or verbalization of changes          Quality of Life Scores:  Scores of 19 and below usually indicate a poorer quality of life in these areas.  A difference of  2-3 points is a clinically meaningful difference.  A difference of 2-3 points in the total score of the Quality of Life Index has been associated with significant improvement in overall quality of life, self-image, physical symptoms, and general health in studies assessing change in quality of life.  PHQ-9: Review Flowsheet  More data may exist      08/06/2024 07/27/2024 05/28/2024 12/27/2023 12/12/2023  Depression screen PHQ 2/9  Decreased Interest 0 0 0 0 0  Down, Depressed, Hopeless 0 0 0 0 0  PHQ - 2 Score 0 0 0 0 0  Altered sleeping - 0 - - -  Tired, decreased energy - 1 - - -  Change in appetite - 0 - - -  Feeling bad or failure about yourself  - 0 - - -  Trouble concentrating - 1 - - -  Moving slowly or fidgety/restless - 0 - - -  Suicidal thoughts - 0 - - -  PHQ-9 Score - 2 - - -  Difficult doing work/chores - Not difficult at all - - -   Interpretation of Total Score  Total Score Depression Severity:  1-4 = Minimal depression, 5-9 = Mild depression, 10-14 = Moderate depression, 15-19 = Moderately severe depression, 20-27 = Severe depression    Psychosocial Evaluation and Intervention:  Psychosocial Evaluation - 07/27/24 1358       Psychosocial Evaluation & Interventions   Interventions Stress management education;Relaxation education;Encouraged to exercise with the program and  follow exercise prescription    Comments Gordy expresses that his SOB causes him to feel stressed at times. The decline in his shortness of breath has caused him and his wife to not be able to travel as much as they would like. Staff will provide Gordy with resources on how to manage his stress.    Expected Outcomes For Gordy to have less stress while participating in PR    Continue Psychosocial Services  No Follow up required          Psychosocial Re-Evaluation:  Psychosocial Re-Evaluation     Row Name 07/31/24 1425             Psychosocial Re-Evaluation   Current issues with Current Stress Concerns       Comments 30 day psy/soc re-eval as follows: Gordy is scheduled to start PR on 08/04/24. No new barriers or concerns since orientation.       Expected Outcomes For Gordy to have less stress while participating in PR       Interventions Encouraged to attend Pulmonary Rehabilitation for the exercise       Continue Psychosocial Services  No Follow up required          Psychosocial Discharge (Final Psychosocial Re-Evaluation):  Psychosocial Re-Evaluation - 07/31/24 1425       Psychosocial Re-Evaluation   Current issues with Current Stress Concerns    Comments 30 day psy/soc re-eval as follows: Gordy is scheduled to start PR on 08/04/24. No new barriers or concerns since orientation.    Expected Outcomes For Gordy to have less stress while participating in PR    Interventions Encouraged to attend Pulmonary Rehabilitation for the exercise    Continue Psychosocial Services  No Follow up required          Education: Education Goals: Education classes will be provided on a weekly basis, covering required topics. Participant will state understanding/return  demonstration of topics presented.  Learning Barriers/Preferences:  Learning Barriers/Preferences - 07/27/24 1334       Learning Barriers/Preferences   Learning Barriers Sight   wears glasses   Learning Preferences None          Education Topics: Know Your Numbers Group instruction that is supported by a PowerPoint presentation. Instructor discusses importance of knowing and understanding resting, exercise, and post-exercise oxygen saturation, heart rate, and blood pressure. Oxygen saturation, heart rate, blood pressure, rating of perceived exertion, and dyspnea are reviewed along with a normal range for these values.    Exercise for the Pulmonary Patient Group instruction that is supported by a PowerPoint presentation. Instructor discusses benefits of exercise, core components of exercise, frequency, duration, and intensity of an exercise routine, importance of utilizing pulse oximetry during exercise, safety while exercising, and options of places to exercise outside of rehab.    MET Level  Group instruction provided by PowerPoint, verbal discussion, and written material to support subject matter. Instructor reviews what METs are and how to increase METs.    Pulmonary Medications Verbally interactive group education provided by instructor with focus on inhaled medications and proper administration.   Anatomy and Physiology of the Respiratory System Group instruction provided by PowerPoint, verbal discussion, and written material to support subject matter. Instructor reviews respiratory cycle and anatomical components of the respiratory system and their functions. Instructor also reviews differences in obstructive and restrictive respiratory diseases with examples of each.    Oxygen Safety Group instruction provided by PowerPoint, verbal discussion, and written material to support subject matter.  There is an overview of What is Oxygen and Why do we need it.  Instructor also  reviews how to create a safe environment for oxygen use, the importance of using oxygen as prescribed, and the risks of noncompliance. There is a brief discussion on traveling with oxygen and resources the patient may utilize.   Oxygen Use Group instruction provided by PowerPoint, verbal discussion, and written material to discuss how supplemental oxygen is prescribed and different types of oxygen supply systems. Resources for more information are provided.  Flowsheet Row PULMONARY REHAB OTHER RESPIRATORY from 08/06/2024 in [facility]  Date 08/06/24  Educator RT  Instruction Review Code 1- Verbalizes Understanding    Breathing Techniques Group instruction that is supported by demonstration and informational handouts. Instructor discusses the benefits of pursed lip and diaphragmatic breathing and detailed demonstration on how to perform both.     Risk Factor Reduction Group instruction that is supported by a PowerPoint presentation. Instructor discusses the definition of a risk factor, different risk factors for pulmonary disease, and how the heart and lungs work together.   Pulmonary Diseases Group instruction provided by PowerPoint, verbal discussion, and written material to support subject matter. Instructor gives an overview of the different type of pulmonary diseases. There is also a discussion on risk factors and symptoms as well as ways to manage the diseases.   Stress and Energy Conservation Group instruction provided by PowerPoint, verbal discussion, and written material to support subject matter. Instructor gives an overview of stress and the impact it can have on the body. Instructor also reviews ways to reduce stress. There is also a discussion on energy conservation and ways to conserve energy throughout the day.   Warning Signs and Symptoms Group instruction provided by PowerPoint, verbal discussion, and written  material to support subject matter. Instructor reviews warning signs and symptoms of stroke, heart attack, cold and flu. Instructor also reviews ways to prevent the spread of infection.   Other Education Group or individual verbal, written, or video instructions that support the educational goals of the pulmonary rehab program.    Knowledge Questionnaire Score:  Knowledge Questionnaire Score - 07/27/24 1318       Knowledge Questionnaire Score   Pre Score 16/18          Core Components/Risk Factors/Patient Goals at Admission:  Personal Goals and Risk Factors at Admission - 07/27/24 1334       Core Components/Risk Factors/Patient Goals on Admission    Weight Management Weight Loss;Yes    Intervention Weight Management: Develop a combined nutrition and exercise program designed to reach desired caloric intake, while maintaining appropriate intake of nutrient and fiber, sodium and fats, and appropriate energy expenditure required for the weight goal.;Weight Management: Provide education and appropriate resources to help participant work on and attain dietary goals.;Weight Management/Obesity: Establish reasonable short term and long term weight goals.;Obesity: Provide education and appropriate resources to help participant work on and attain dietary goals.    Admit Weight 258 lb 9.6 oz (117.3 kg)    Expected Outcomes Short Term: Continue to assess and modify interventions until short term weight is achieved;Long Term: Adherence to nutrition and physical activity/exercise program aimed toward attainment of established weight goal;Weight Loss: Understanding of general recommendations for a balanced deficit meal plan, which promotes 1-2 lb weight loss per week and includes a negative energy balance of [phone] kcal/d;Understanding recommendations for meals to include 15-35% energy as protein, 25-35% energy from  fat, 35-60% energy from carbohydrates, less than 200mg  of dietary cholesterol, 20-35 gm  of total fiber daily;Understanding of distribution of calorie intake throughout the day with the consumption of 4-5 meals/snacks    Improve shortness of breath with ADL's Yes    Intervention Provide education, individualized exercise plan and daily activity instruction to help decrease symptoms of SOB with activities of daily living.    Expected Outcomes Long Term: Be able to perform more ADLs without symptoms or delay the onset of symptoms;Short Term: Improve cardiorespiratory fitness to achieve a reduction of symptoms when performing ADLs          Core Components/Risk Factors/Patient Goals Review:   Goals and Risk Factor Review     Row Name 07/31/24 1426             Core Components/Risk Factors/Patient Goals Review   Personal Goals Review Weight Management/Obesity;Improve shortness of breath with ADL's;Develop more efficient breathing techniques such as purse lipped breathing and diaphragmatic breathing and practicing self-pacing with activity.       Review Monthly review of patients Core Components/Risk Factors/Patient Goals are as follows: Gordy is scheduled to start PR on 08/04/24. Unable to asses his goals at this time.       Expected Outcomes To improve shortness of breath with ADL's, lose weight and develop more efficient breathing techniques such as purse lipped breathing and diaphragmatic breathing; and practicing self-pacing with activity.          Core Components/Risk Factors/Patient Goals at Discharge (Final Review):   Goals and Risk Factor Review - 07/31/24 1426       Core Components/Risk Factors/Patient Goals Review   Personal Goals Review Weight Management/Obesity;Improve shortness of breath with ADL's;Develop more efficient breathing techniques such as purse lipped breathing and diaphragmatic breathing and practicing self-pacing with activity.    Review Monthly review of patients Core Components/Risk Factors/Patient Goals are as follows: Gordy is scheduled to start PR on  08/04/24. Unable to asses his goals at this time.    Expected Outcomes To improve shortness of breath with ADL's, lose weight and develop more efficient breathing techniques such as purse lipped breathing and diaphragmatic breathing; and practicing self-pacing with activity.          ITP Comments:Pt is making expected progress toward Pulmonary Rehab goals after completing 2 session(s). Recommend continued exercise, life style modification, education, and utilization of breathing techniques to increase stamina and strength, while also decreasing shortness of breath with exertion.  Dr. Slater Staff is Medical Director for Pulmonary Rehab at [facility].          [1]  Current Outpatient Medications:    acetaminophen  (TYLENOL ) 500 MG tablet, Take 1,000 mg by mouth every 6 (six) hours as needed for mild pain (pain score 1-3) or moderate pain (pain score 4-6)., Disp: , Rfl:    amLODipine  (NORVASC ) 2.5 MG tablet, Take 1 tablet (2.5 mg total) by mouth daily. (Patient not taking: Reported on 08/06/2024), Disp: 30 tablet, Rfl: 0   amLODipine  (NORVASC ) 5 MG tablet, Take 1 tablet (5 mg total) by mouth daily., Disp: 90 tablet, Rfl: 1   aspirin EC 81 MG tablet, Take 81 mg by mouth daily. Swallow whole., Disp: , Rfl:    azelastine (ASTELIN) 0.1 % nasal spray, Place 1 spray into both nostrils daily as needed for rhinitis or allergies. Use in each nostril as directed, Disp: , Rfl:    benazepril  (LOTENSIN ) 20 MG tablet, Take 1 tablet (20 mg total) by  mouth daily., Disp: 90 tablet, Rfl: 1   carvedilol  (COREG ) 12.5 MG tablet, Take 1 tablet (12.5 mg total) by mouth 2 (two) times daily., Disp: 180 tablet, Rfl: 1   fexofenadine  (ALLEGRA ) 180 MG tablet, Take 1 tablet (180 mg total) by mouth daily as needed for allergies or rhinitis., Disp: 90 tablet, Rfl: 1   ibuprofen (ADVIL) 200 MG tablet, Take 200 mg by mouth as needed., Disp: , Rfl:    LUTEIN PO, Take 25 mg by mouth daily., Disp: , Rfl:    Multiple  Vitamin (MULTIVITAMIN WITH MINERALS) TABS tablet, Take 1 tablet by mouth daily., Disp: , Rfl:    rosuvastatin  (CRESTOR ) 5 MG tablet, Take 1 tablet (5 mg total) by mouth daily., Disp: 90 tablet, Rfl: 1   SYMBICORT  160-4.5 MCG/ACT inhaler, Inhale 2 puffs into the lungs 2 (two) times daily., Disp: 1 each, Rfl: 12 [2]  Social History Tobacco Use  Smoking Status Never   Passive exposure: Never  Smokeless Tobacco Never

## 2024-08-13 ENCOUNTER — Encounter (HOSPITAL_COMMUNITY)

## 2024-08-18 ENCOUNTER — Encounter (HOSPITAL_COMMUNITY)
Admission: RE | Admit: 2024-08-18 | Discharge: 2024-08-18 | Disposition: A | Source: Ambulatory Visit | Attending: Pulmonary Disease

## 2024-08-18 DIAGNOSIS — J984 Other disorders of lung: Secondary | ICD-10-CM | POA: Diagnosis not present

## 2024-08-18 NOTE — Progress Notes (Signed)
 Daily Session Note  Patient Details  Name: Michael Pace MRN: [id number] Date of Birth: 10-04-1945 Referring Provider:   Flowsheet Row PULMONARY REHAB OTHER RESP ORIENTATION from 07/27/2024 in [facility]  Referring Provider Kassie    Encounter Date: 08/18/2024  Check In:  Session Check In - 08/18/24 1013       Check-In   Supervising physician immediately available to respond to emergencies [facility] MD immediately available    Physician(s) Glendia Ferrier, PA    Location [facility]    Staff Present Ronal Levin, RN, BSN;Casey Claudene, RT;Randi [facility] BS, ACSM-CEP, Exercise Physiologist;Samarie Pinder Nicholaus, MS, ACSM-CEP, Exercise Physiologist    Virtual Visit No    Medication changes reported     No    Fall or balance concerns reported    Yes    Tobacco Cessation No Change    Warm-up and Cool-down Performed as group-led instruction    Resistance Training Performed Yes    VAD Patient? No    PAD/SET Patient? No      Pain Assessment   Currently in Pain? No/denies    Multiple Pain Sites No          Capillary Blood Glucose: No results found for this or any previous visit (from the past 24 hours).    Tobacco Use History[1]  Goals Met:  Proper associated with RPD/PD & O2 Sat Exercise tolerated well No report of concerns or symptoms today Strength training completed today  Goals Unmet:  Not Applicable  Comments: Service time is from 1015 to 1115.    Dr. Slater Kassie is Medical Director for Pulmonary Rehab at [facility].     [1]  Social History Tobacco Use  Smoking Status Never   Passive exposure: Never  Smokeless Tobacco Never

## 2024-08-20 ENCOUNTER — Encounter (HOSPITAL_COMMUNITY)
Admission: RE | Admit: 2024-08-20 | Discharge: 2024-08-20 | Disposition: A | Discharge status: Home / Self Care | Source: Ambulatory Visit | Attending: Pulmonary Disease | Admitting: Pulmonary Disease

## 2024-08-20 ENCOUNTER — Telehealth (HOSPITAL_COMMUNITY): Payer: Self-pay

## 2024-08-20 DIAGNOSIS — J984 Other disorders of lung: Secondary | ICD-10-CM | POA: Diagnosis not present

## 2024-08-20 NOTE — Telephone Encounter (Signed)
 Called pt to inform him elevator down at [facility]. Pt stated he can walk up stairs. Informed him to park on 2nd level.

## 2024-08-20 NOTE — Progress Notes (Signed)
 Daily Session Note  Patient Details  Name: Michael Pace MRN: [id number] Date of Birth: Oct 20, 1945 Referring Provider:   Flowsheet Row PULMONARY REHAB OTHER RESP ORIENTATION from 07/27/2024 in [facility]  Referring Provider Kassie    Encounter Date: 08/20/2024  Check In:  Session Check In - 08/20/24 1031       Check-In   Supervising physician immediately available to respond to emergencies [facility] MD immediately available    Physician(s) Josefa Beauvais, NP    Location [facility]    Staff Present Ronal Levin, RN, BSN;Rinnah Peppel Claudene, RT;Randi [facility] BS, ACSM-CEP, Exercise Physiologist;Kaylee Nicholaus, MS, ACSM-CEP, Exercise Physiologist    Virtual Visit No    Medication changes reported     No    Fall or balance concerns reported    Yes    Tobacco Cessation No Change    Warm-up and Cool-down Performed as group-led instruction    Resistance Training Performed Yes    VAD Patient? No    PAD/SET Patient? No      Pain Assessment   Currently in Pain? No/denies    Multiple Pain Sites No          Capillary Blood Glucose: No results found for this or any previous visit (from the past 24 hours).    Tobacco Use History[1]  Goals Met:  Proper associated with RPD/PD & O2 Sat Independence with exercise equipment Exercise tolerated well No report of concerns or symptoms today Strength training completed today  Goals Unmet:  Not Applicable  Comments: Service time is from 1007 to 1145.    Dr. Slater Kassie is Medical Director for Pulmonary Rehab at [facility].     [1]  Social History Tobacco Use  Smoking Status Never   Passive exposure: Never  Smokeless Tobacco Never

## 2024-08-25 ENCOUNTER — Encounter (HOSPITAL_COMMUNITY)
Admission: RE | Admit: 2024-08-25 | Discharge: 2024-08-25 | Disposition: A | Source: Ambulatory Visit | Attending: Pulmonary Disease

## 2024-08-25 VITALS — Wt 253.1 lb

## 2024-08-25 DIAGNOSIS — J984 Other disorders of lung: Secondary | ICD-10-CM

## 2024-08-27 ENCOUNTER — Encounter (HOSPITAL_COMMUNITY)

## 2024-09-01 ENCOUNTER — Ambulatory Visit: Admitting: Audiologist

## 2024-09-01 ENCOUNTER — Encounter (HOSPITAL_COMMUNITY)

## 2024-09-03 ENCOUNTER — Encounter (HOSPITAL_COMMUNITY)

## 2024-09-08 ENCOUNTER — Encounter (HOSPITAL_COMMUNITY)

## 2024-09-08 ENCOUNTER — Ambulatory Visit (HOSPITAL_BASED_OUTPATIENT_CLINIC_OR_DEPARTMENT_OTHER): Admitting: Pulmonary Disease

## 2024-09-10 ENCOUNTER — Encounter (HOSPITAL_COMMUNITY)

## 2024-09-15 ENCOUNTER — Encounter (HOSPITAL_COMMUNITY)

## 2024-09-17 ENCOUNTER — Encounter (HOSPITAL_COMMUNITY)

## 2024-09-22 ENCOUNTER — Encounter (HOSPITAL_COMMUNITY)

## 2024-09-24 ENCOUNTER — Encounter (HOSPITAL_COMMUNITY)

## 2024-09-29 ENCOUNTER — Encounter (HOSPITAL_COMMUNITY)

## 2024-10-01 ENCOUNTER — Encounter: Admitting: Nurse Practitioner

## 2024-10-01 ENCOUNTER — Encounter (HOSPITAL_COMMUNITY)

## 2024-10-06 ENCOUNTER — Encounter (HOSPITAL_COMMUNITY)

## 2024-10-08 ENCOUNTER — Encounter (HOSPITAL_COMMUNITY)

## 2024-10-13 ENCOUNTER — Encounter (HOSPITAL_COMMUNITY)

## 2024-10-15 ENCOUNTER — Encounter (HOSPITAL_COMMUNITY)

## 2024-10-20 ENCOUNTER — Encounter (HOSPITAL_COMMUNITY)

## 2024-10-22 ENCOUNTER — Encounter (HOSPITAL_COMMUNITY)

## 2024-12-10 ENCOUNTER — Encounter: Admitting: Nurse Practitioner

## 2025-08-12 ENCOUNTER — Ambulatory Visit: Admitting: Nurse Practitioner
